# Patient Record
Sex: Male | Born: 1955 | Race: White | Hispanic: No | Marital: Married | Smoking: Never smoker
Health system: Southern US, Community
[De-identification: ages and names within clinical notes are randomized; demographics above are authoritative.]

## PROBLEM LIST (undated history)

## (undated) DIAGNOSIS — R112 Nausea with vomiting, unspecified: Secondary | ICD-10-CM

## (undated) DIAGNOSIS — K219 Gastro-esophageal reflux disease without esophagitis: Secondary | ICD-10-CM

## (undated) DIAGNOSIS — T8859XA Other complications of anesthesia, initial encounter: Secondary | ICD-10-CM

## (undated) DIAGNOSIS — B3742 Candidal balanitis: Secondary | ICD-10-CM

## (undated) DIAGNOSIS — S82892A Other fracture of left lower leg, initial encounter for closed fracture: Secondary | ICD-10-CM

## (undated) DIAGNOSIS — J329 Chronic sinusitis, unspecified: Secondary | ICD-10-CM

## (undated) DIAGNOSIS — M4306 Spondylolysis, lumbar region: Secondary | ICD-10-CM

## (undated) DIAGNOSIS — K429 Umbilical hernia without obstruction or gangrene: Secondary | ICD-10-CM

## (undated) DIAGNOSIS — Z9889 Other specified postprocedural states: Secondary | ICD-10-CM

## (undated) DIAGNOSIS — S62639B Displaced fracture of distal phalanx of unspecified finger, initial encounter for open fracture: Secondary | ICD-10-CM

## (undated) DIAGNOSIS — M4802 Spinal stenosis, cervical region: Secondary | ICD-10-CM

## (undated) DIAGNOSIS — J302 Other seasonal allergic rhinitis: Secondary | ICD-10-CM

## (undated) DIAGNOSIS — J189 Pneumonia, unspecified organism: Secondary | ICD-10-CM

## (undated) DIAGNOSIS — E785 Hyperlipidemia, unspecified: Secondary | ICD-10-CM

## (undated) DIAGNOSIS — N529 Male erectile dysfunction, unspecified: Secondary | ICD-10-CM

## (undated) DIAGNOSIS — K644 Residual hemorrhoidal skin tags: Secondary | ICD-10-CM

## (undated) DIAGNOSIS — N401 Enlarged prostate with lower urinary tract symptoms: Secondary | ICD-10-CM

## (undated) DIAGNOSIS — M5030 Other cervical disc degeneration, unspecified cervical region: Secondary | ICD-10-CM

## (undated) DIAGNOSIS — L719 Rosacea, unspecified: Secondary | ICD-10-CM

## (undated) DIAGNOSIS — M5134 Other intervertebral disc degeneration, thoracic region: Secondary | ICD-10-CM

## (undated) DIAGNOSIS — K2970 Gastritis, unspecified, without bleeding: Secondary | ICD-10-CM

## (undated) DIAGNOSIS — K317 Polyp of stomach and duodenum: Secondary | ICD-10-CM

## (undated) HISTORY — DX: Other seasonal allergic rhinitis: J30.2

## (undated) HISTORY — DX: Other cervical disc degeneration, unspecified cervical region: M50.30

## (undated) HISTORY — DX: Chronic sinusitis, unspecified: J32.9

## (undated) HISTORY — PX: HEMORRHOID SURGERY: SHX153

## (undated) HISTORY — DX: Other fracture of left lower leg, initial encounter for closed fracture: S82.892A

## (undated) HISTORY — DX: Gastro-esophageal reflux disease without esophagitis: K21.9

## (undated) HISTORY — DX: Hyperlipidemia, unspecified: E78.5

## (undated) HISTORY — DX: Spondylolysis, lumbar region: M43.06

## (undated) HISTORY — DX: Displaced fracture of distal phalanx of unspecified finger, initial encounter for open fracture: S62.639B

## (undated) HISTORY — DX: Umbilical hernia without obstruction or gangrene: K42.9

## (undated) HISTORY — DX: Residual hemorrhoidal skin tags: K64.4

## (undated) HISTORY — PX: VEIN LIGATION AND STRIPPING: SHX2653

## (undated) HISTORY — PX: INGUINAL HERNIA REPAIR: SUR1180

## (undated) HISTORY — DX: Male erectile dysfunction, unspecified: N52.9

## (undated) HISTORY — DX: Other intervertebral disc degeneration, thoracic region: M51.34

## (undated) HISTORY — DX: Gastritis, unspecified, without bleeding: K29.70

## (undated) HISTORY — PX: HERNIA REPAIR: SHX51

## (undated) HISTORY — DX: Candidal balanitis: B37.42

## (undated) HISTORY — DX: Polyp of stomach and duodenum: K31.7

## (undated) HISTORY — DX: Rosacea, unspecified: L71.9

## (undated) HISTORY — PX: ROTATOR CUFF REPAIR: SHX139

## (undated) HISTORY — PX: COLONOSCOPY: SHX174

## (undated) HISTORY — DX: Spinal stenosis, cervical region: M48.02

## (undated) HISTORY — PX: GALLBLADDER SURGERY: SHX652

## (undated) HISTORY — PX: CHOLECYSTECTOMY: SHX55

## (undated) HISTORY — DX: Benign prostatic hyperplasia with lower urinary tract symptoms: N40.1

---

## 2004-11-11 ENCOUNTER — Encounter: Admission: RE | Admit: 2004-11-11 | Discharge: 2004-11-11 | Payer: Self-pay | Admitting: Unknown Physician Specialty

## 2004-12-21 ENCOUNTER — Encounter: Admission: RE | Admit: 2004-12-21 | Discharge: 2004-12-21 | Payer: Self-pay | Admitting: Unknown Physician Specialty

## 2004-12-26 ENCOUNTER — Ambulatory Visit: Payer: Self-pay | Admitting: Internal Medicine

## 2004-12-26 ENCOUNTER — Inpatient Hospital Stay (HOSPITAL_COMMUNITY): Admission: EM | Admit: 2004-12-26 | Discharge: 2004-12-29 | Payer: Self-pay | Admitting: Emergency Medicine

## 2005-01-06 ENCOUNTER — Encounter: Admission: RE | Admit: 2005-01-06 | Discharge: 2005-01-06 | Payer: Self-pay | Admitting: Internal Medicine

## 2005-01-20 ENCOUNTER — Encounter: Admission: RE | Admit: 2005-01-20 | Discharge: 2005-01-20 | Payer: Self-pay | Admitting: Unknown Physician Specialty

## 2005-05-25 ENCOUNTER — Encounter: Admission: RE | Admit: 2005-05-25 | Discharge: 2005-05-25 | Payer: Self-pay | Admitting: Internal Medicine

## 2006-01-05 IMAGING — US IR TRANSCATH EMBOLIZATION NON-NEURO EA OP FIELD
1 series · 1 of 1 positions shown · non-contrast
Comparison: none

CLINICAL DATA: Symptomatic right leg varicose veins.

[Series 1: unknown · 0.07mm/px · 1 of 1 slices shown]
[im 1/1]
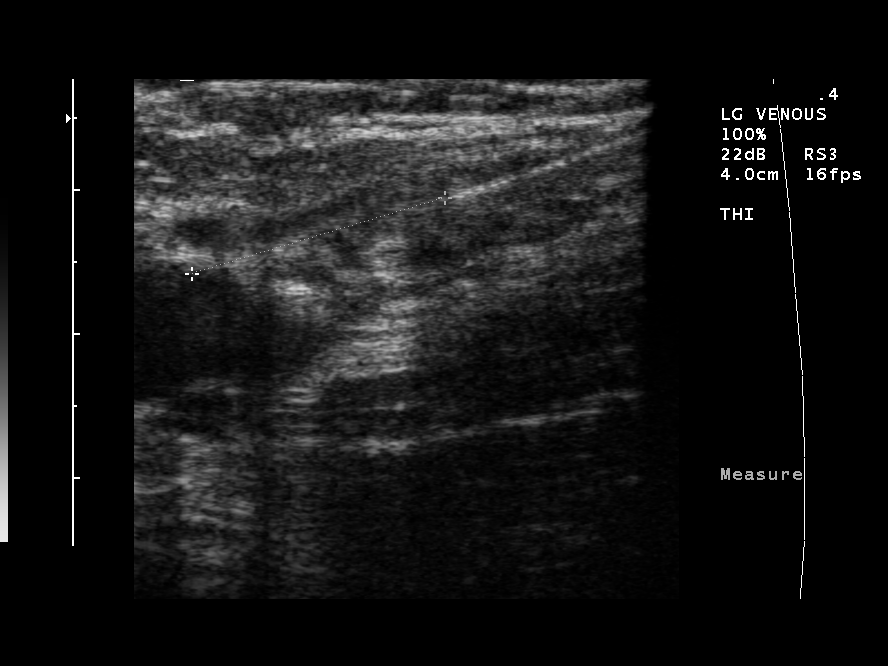

[1 of 1 positions shown; findings below may reference images not displayed]

TRANSCATHETER LASER OCCLUSION  RIGHT GREATER SAPHENOUS VEIN WITH ULTRASOUND
GUIDANCE:

Survey ultrasound of the leg was performed and the course of the greater
saphenous vein was marked on the skin. Overlying skin prepped with Betadine,
draped in usual sterile fashion. After local anesthetic using 1% lidocaine, the
greater saphenous vein was accessed just above the ankle level under ultrasound
with a 21-gauge micropuncture needle. A 018 guidewire advanced easily. This was
exchanged using a transitional dilator for a 035 J wire. This passed to the
saphenofemoral junction. Over this, the 6 French delivery sheath was advanced to
the central aspect of the right greater saphenous vein; it was approximately 7
cm distal to the saphenofemoral junction. For this reason, a second access along
the right greater saphenous vein above-the-knee was achieved using a 21-gauge
micropuncture needle as above with ultrasound guidance. The 018 guidewire
advanced easily, and a transitional dilator was placed. The laser fiber was 
positioned just cephalad to the second access site. Tumescent dilute 0.1%
lidocaine  used along the planned inferior treatment length. Ultrasound was used
to confirm appropriate tumescent anesthesia and to verify that all segments were
at least 1 cm from the skin surface. The laser fiber was activated while being
withdrawn over the treatment length, delivering 1125 joules over 183 seconds.
The catheter and sheath were removed and hemostasis easily achieved at the site.
The 035 J wire was advanced through the transitional dilator beyond the
saphenofemoral junction. Over this, the 6 French delivery sheath was advanced
beyond the saphenofemoral junction. The laser fiber was advanced and positioned
18 mm from the saphenofemoral junction. Tumescent dilute 0.1% lidocaine  used
along the planned treatment length. A total of 500 mL was used for the case.
Ultrasound was used to confirm appropriate tumescent anesthesia and to verify
that all segments were at least 1 cm from the skin surface. The laser fiber was
activated while being withdrawn over the treatment length, delivering 0090
joules over 180 seconds. The catheter and sheath were removed and hemostasis
easily achieved at the site. Patient was placed in graduated compression
stockings and ambulated for 20 minutes without difficulty. Patient tolerated the
procedure well, with no immediate complication.
IMPRESSION: 1. Technically successful transcatheter laser occlusion of right greater
saphenous vein. Patient will followup in clinic in one week. Patient knows to

## 2010-03-13 ENCOUNTER — Encounter: Payer: Self-pay | Admitting: Unknown Physician Specialty

## 2015-06-17 DIAGNOSIS — M12572 Traumatic arthropathy, left ankle and foot: Secondary | ICD-10-CM

## 2015-06-17 HISTORY — DX: Traumatic arthropathy, left ankle and foot: M12.572

## 2016-03-03 DIAGNOSIS — H25813 Combined forms of age-related cataract, bilateral: Secondary | ICD-10-CM | POA: Diagnosis not present

## 2016-04-05 DIAGNOSIS — M545 Low back pain: Secondary | ICD-10-CM | POA: Diagnosis not present

## 2016-04-05 DIAGNOSIS — M5442 Lumbago with sciatica, left side: Secondary | ICD-10-CM | POA: Diagnosis not present

## 2016-04-05 DIAGNOSIS — M5441 Lumbago with sciatica, right side: Secondary | ICD-10-CM | POA: Diagnosis not present

## 2016-04-08 DIAGNOSIS — M50323 Other cervical disc degeneration at C6-C7 level: Secondary | ICD-10-CM | POA: Diagnosis not present

## 2016-04-08 DIAGNOSIS — K219 Gastro-esophageal reflux disease without esophagitis: Secondary | ICD-10-CM | POA: Diagnosis not present

## 2016-04-08 DIAGNOSIS — M5412 Radiculopathy, cervical region: Secondary | ICD-10-CM | POA: Diagnosis not present

## 2016-04-08 DIAGNOSIS — M50322 Other cervical disc degeneration at C5-C6 level: Secondary | ICD-10-CM | POA: Diagnosis not present

## 2016-04-08 DIAGNOSIS — M5134 Other intervertebral disc degeneration, thoracic region: Secondary | ICD-10-CM | POA: Diagnosis not present

## 2016-04-08 DIAGNOSIS — R0789 Other chest pain: Secondary | ICD-10-CM | POA: Diagnosis not present

## 2016-04-08 DIAGNOSIS — M546 Pain in thoracic spine: Secondary | ICD-10-CM | POA: Diagnosis not present

## 2016-04-08 DIAGNOSIS — M50221 Other cervical disc displacement at C4-C5 level: Secondary | ICD-10-CM | POA: Diagnosis not present

## 2016-04-12 DIAGNOSIS — M5114 Intervertebral disc disorders with radiculopathy, thoracic region: Secondary | ICD-10-CM | POA: Diagnosis not present

## 2016-04-12 DIAGNOSIS — M5012 Mid-cervical disc disorder, unspecified level: Secondary | ICD-10-CM | POA: Diagnosis not present

## 2016-04-12 DIAGNOSIS — M6281 Muscle weakness (generalized): Secondary | ICD-10-CM | POA: Diagnosis not present

## 2016-04-14 DIAGNOSIS — M6281 Muscle weakness (generalized): Secondary | ICD-10-CM | POA: Diagnosis not present

## 2016-04-14 DIAGNOSIS — M5114 Intervertebral disc disorders with radiculopathy, thoracic region: Secondary | ICD-10-CM | POA: Diagnosis not present

## 2016-04-14 DIAGNOSIS — M5012 Mid-cervical disc disorder, unspecified level: Secondary | ICD-10-CM | POA: Diagnosis not present

## 2016-04-15 DIAGNOSIS — M5030 Other cervical disc degeneration, unspecified cervical region: Secondary | ICD-10-CM | POA: Diagnosis not present

## 2016-04-15 DIAGNOSIS — M4726 Other spondylosis with radiculopathy, lumbar region: Secondary | ICD-10-CM | POA: Diagnosis not present

## 2016-04-21 DIAGNOSIS — M5012 Mid-cervical disc disorder, unspecified level: Secondary | ICD-10-CM | POA: Diagnosis not present

## 2016-04-21 DIAGNOSIS — M6281 Muscle weakness (generalized): Secondary | ICD-10-CM | POA: Diagnosis not present

## 2016-04-21 DIAGNOSIS — M5114 Intervertebral disc disorders with radiculopathy, thoracic region: Secondary | ICD-10-CM | POA: Diagnosis not present

## 2016-04-22 DIAGNOSIS — M5137 Other intervertebral disc degeneration, lumbosacral region: Secondary | ICD-10-CM | POA: Diagnosis not present

## 2016-04-22 DIAGNOSIS — M542 Cervicalgia: Secondary | ICD-10-CM | POA: Diagnosis not present

## 2016-04-22 DIAGNOSIS — M545 Low back pain: Secondary | ICD-10-CM | POA: Diagnosis not present

## 2016-04-26 DIAGNOSIS — M5114 Intervertebral disc disorders with radiculopathy, thoracic region: Secondary | ICD-10-CM | POA: Diagnosis not present

## 2016-04-26 DIAGNOSIS — M6281 Muscle weakness (generalized): Secondary | ICD-10-CM | POA: Diagnosis not present

## 2016-04-26 DIAGNOSIS — M5012 Mid-cervical disc disorder, unspecified level: Secondary | ICD-10-CM | POA: Diagnosis not present

## 2016-05-18 DIAGNOSIS — M5137 Other intervertebral disc degeneration, lumbosacral region: Secondary | ICD-10-CM | POA: Diagnosis not present

## 2016-05-18 DIAGNOSIS — M542 Cervicalgia: Secondary | ICD-10-CM | POA: Diagnosis not present

## 2016-05-18 DIAGNOSIS — M50123 Cervical disc disorder at C6-C7 level with radiculopathy: Secondary | ICD-10-CM | POA: Diagnosis not present

## 2016-05-27 DIAGNOSIS — L719 Rosacea, unspecified: Secondary | ICD-10-CM | POA: Diagnosis not present

## 2016-05-27 DIAGNOSIS — Z0000 Encounter for general adult medical examination without abnormal findings: Secondary | ICD-10-CM | POA: Diagnosis not present

## 2016-05-27 DIAGNOSIS — R7301 Impaired fasting glucose: Secondary | ICD-10-CM | POA: Diagnosis not present

## 2016-05-27 DIAGNOSIS — E785 Hyperlipidemia, unspecified: Secondary | ICD-10-CM | POA: Diagnosis not present

## 2016-06-01 DIAGNOSIS — T1512XA Foreign body in conjunctival sac, left eye, initial encounter: Secondary | ICD-10-CM | POA: Diagnosis not present

## 2016-06-09 DIAGNOSIS — L03032 Cellulitis of left toe: Secondary | ICD-10-CM | POA: Diagnosis not present

## 2016-12-02 DIAGNOSIS — E785 Hyperlipidemia, unspecified: Secondary | ICD-10-CM | POA: Diagnosis not present

## 2016-12-02 DIAGNOSIS — R7301 Impaired fasting glucose: Secondary | ICD-10-CM | POA: Diagnosis not present

## 2016-12-02 DIAGNOSIS — Z0000 Encounter for general adult medical examination without abnormal findings: Secondary | ICD-10-CM | POA: Diagnosis not present

## 2016-12-15 DIAGNOSIS — M722 Plantar fascial fibromatosis: Secondary | ICD-10-CM

## 2016-12-15 DIAGNOSIS — L600 Ingrowing nail: Secondary | ICD-10-CM

## 2016-12-15 HISTORY — DX: Plantar fascial fibromatosis: M72.2

## 2016-12-15 HISTORY — DX: Ingrowing nail: L60.0

## 2016-12-29 DIAGNOSIS — M6702 Short Achilles tendon (acquired), left ankle: Secondary | ICD-10-CM

## 2016-12-29 DIAGNOSIS — M722 Plantar fascial fibromatosis: Secondary | ICD-10-CM | POA: Diagnosis not present

## 2016-12-29 DIAGNOSIS — L600 Ingrowing nail: Secondary | ICD-10-CM | POA: Diagnosis not present

## 2016-12-29 HISTORY — DX: Short Achilles tendon (acquired), left ankle: M67.02

## 2017-02-09 DIAGNOSIS — J019 Acute sinusitis, unspecified: Secondary | ICD-10-CM | POA: Diagnosis not present

## 2017-03-14 DIAGNOSIS — R0789 Other chest pain: Secondary | ICD-10-CM | POA: Diagnosis not present

## 2017-03-14 DIAGNOSIS — K219 Gastro-esophageal reflux disease without esophagitis: Secondary | ICD-10-CM | POA: Diagnosis not present

## 2017-03-27 DIAGNOSIS — K5730 Diverticulosis of large intestine without perforation or abscess without bleeding: Secondary | ICD-10-CM | POA: Diagnosis not present

## 2017-03-27 DIAGNOSIS — K219 Gastro-esophageal reflux disease without esophagitis: Secondary | ICD-10-CM | POA: Diagnosis not present

## 2017-03-31 DIAGNOSIS — K219 Gastro-esophageal reflux disease without esophagitis: Secondary | ICD-10-CM | POA: Diagnosis not present

## 2017-03-31 DIAGNOSIS — R0789 Other chest pain: Secondary | ICD-10-CM | POA: Diagnosis not present

## 2017-04-18 DIAGNOSIS — K5730 Diverticulosis of large intestine without perforation or abscess without bleeding: Secondary | ICD-10-CM | POA: Diagnosis not present

## 2017-04-18 DIAGNOSIS — D131 Benign neoplasm of stomach: Secondary | ICD-10-CM | POA: Diagnosis not present

## 2017-04-18 DIAGNOSIS — K621 Rectal polyp: Secondary | ICD-10-CM | POA: Diagnosis not present

## 2017-04-18 DIAGNOSIS — Z1211 Encounter for screening for malignant neoplasm of colon: Secondary | ICD-10-CM | POA: Diagnosis not present

## 2017-04-18 DIAGNOSIS — K317 Polyp of stomach and duodenum: Secondary | ICD-10-CM | POA: Diagnosis not present

## 2017-04-18 DIAGNOSIS — D126 Benign neoplasm of colon, unspecified: Secondary | ICD-10-CM | POA: Diagnosis not present

## 2017-05-22 DIAGNOSIS — D126 Benign neoplasm of colon, unspecified: Secondary | ICD-10-CM | POA: Diagnosis not present

## 2017-05-22 DIAGNOSIS — K5730 Diverticulosis of large intestine without perforation or abscess without bleeding: Secondary | ICD-10-CM | POA: Diagnosis not present

## 2017-05-22 DIAGNOSIS — K760 Fatty (change of) liver, not elsewhere classified: Secondary | ICD-10-CM | POA: Diagnosis not present

## 2017-06-02 DIAGNOSIS — L719 Rosacea, unspecified: Secondary | ICD-10-CM | POA: Diagnosis not present

## 2017-06-02 DIAGNOSIS — Z0000 Encounter for general adult medical examination without abnormal findings: Secondary | ICD-10-CM | POA: Diagnosis not present

## 2017-06-02 DIAGNOSIS — R7301 Impaired fasting glucose: Secondary | ICD-10-CM | POA: Diagnosis not present

## 2017-06-02 DIAGNOSIS — E785 Hyperlipidemia, unspecified: Secondary | ICD-10-CM | POA: Diagnosis not present

## 2017-07-12 DIAGNOSIS — N481 Balanitis: Secondary | ICD-10-CM | POA: Diagnosis not present

## 2017-07-12 DIAGNOSIS — M5030 Other cervical disc degeneration, unspecified cervical region: Secondary | ICD-10-CM | POA: Diagnosis not present

## 2017-10-06 DIAGNOSIS — N471 Phimosis: Secondary | ICD-10-CM | POA: Diagnosis not present

## 2017-10-06 DIAGNOSIS — N3000 Acute cystitis without hematuria: Secondary | ICD-10-CM | POA: Diagnosis not present

## 2017-10-12 DIAGNOSIS — N478 Other disorders of prepuce: Secondary | ICD-10-CM | POA: Diagnosis not present

## 2017-10-12 DIAGNOSIS — N471 Phimosis: Secondary | ICD-10-CM | POA: Diagnosis not present

## 2017-10-24 DIAGNOSIS — N3020 Other chronic cystitis without hematuria: Secondary | ICD-10-CM | POA: Diagnosis not present

## 2017-10-24 DIAGNOSIS — N471 Phimosis: Secondary | ICD-10-CM | POA: Diagnosis not present

## 2017-11-07 DIAGNOSIS — N471 Phimosis: Secondary | ICD-10-CM | POA: Diagnosis not present

## 2017-11-07 DIAGNOSIS — N3020 Other chronic cystitis without hematuria: Secondary | ICD-10-CM | POA: Diagnosis not present

## 2017-11-15 DIAGNOSIS — K580 Irritable bowel syndrome with diarrhea: Secondary | ICD-10-CM | POA: Diagnosis not present

## 2017-11-15 DIAGNOSIS — Z6833 Body mass index (BMI) 33.0-33.9, adult: Secondary | ICD-10-CM | POA: Diagnosis not present

## 2017-11-15 DIAGNOSIS — K219 Gastro-esophageal reflux disease without esophagitis: Secondary | ICD-10-CM | POA: Diagnosis not present

## 2017-12-07 DIAGNOSIS — E785 Hyperlipidemia, unspecified: Secondary | ICD-10-CM | POA: Diagnosis not present

## 2017-12-07 DIAGNOSIS — R7301 Impaired fasting glucose: Secondary | ICD-10-CM | POA: Diagnosis not present

## 2017-12-07 DIAGNOSIS — L719 Rosacea, unspecified: Secondary | ICD-10-CM | POA: Diagnosis not present

## 2018-02-23 DIAGNOSIS — K589 Irritable bowel syndrome without diarrhea: Secondary | ICD-10-CM | POA: Diagnosis not present

## 2018-02-23 DIAGNOSIS — J019 Acute sinusitis, unspecified: Secondary | ICD-10-CM | POA: Diagnosis not present

## 2018-03-19 DIAGNOSIS — J019 Acute sinusitis, unspecified: Secondary | ICD-10-CM | POA: Diagnosis not present

## 2018-03-28 DIAGNOSIS — Z6833 Body mass index (BMI) 33.0-33.9, adult: Secondary | ICD-10-CM | POA: Diagnosis not present

## 2018-03-28 DIAGNOSIS — R1032 Left lower quadrant pain: Secondary | ICD-10-CM | POA: Diagnosis not present

## 2018-04-02 DIAGNOSIS — N400 Enlarged prostate without lower urinary tract symptoms: Secondary | ICD-10-CM | POA: Diagnosis not present

## 2018-04-02 DIAGNOSIS — R1032 Left lower quadrant pain: Secondary | ICD-10-CM | POA: Diagnosis not present

## 2018-04-02 DIAGNOSIS — K5730 Diverticulosis of large intestine without perforation or abscess without bleeding: Secondary | ICD-10-CM | POA: Diagnosis not present

## 2018-04-27 DIAGNOSIS — R1032 Left lower quadrant pain: Secondary | ICD-10-CM | POA: Diagnosis not present

## 2018-04-27 DIAGNOSIS — M47817 Spondylosis without myelopathy or radiculopathy, lumbosacral region: Secondary | ICD-10-CM | POA: Diagnosis not present

## 2018-06-12 DIAGNOSIS — R7301 Impaired fasting glucose: Secondary | ICD-10-CM | POA: Diagnosis not present

## 2018-06-12 DIAGNOSIS — L719 Rosacea, unspecified: Secondary | ICD-10-CM | POA: Diagnosis not present

## 2018-06-12 DIAGNOSIS — Z0000 Encounter for general adult medical examination without abnormal findings: Secondary | ICD-10-CM | POA: Diagnosis not present

## 2018-06-12 DIAGNOSIS — E785 Hyperlipidemia, unspecified: Secondary | ICD-10-CM | POA: Diagnosis not present

## 2019-03-20 ENCOUNTER — Other Ambulatory Visit: Payer: Self-pay

## 2019-03-20 ENCOUNTER — Encounter: Payer: Self-pay | Admitting: Pulmonary Disease

## 2019-03-20 ENCOUNTER — Ambulatory Visit (INDEPENDENT_AMBULATORY_CARE_PROVIDER_SITE_OTHER): Payer: Commercial Managed Care - PPO

## 2019-03-20 ENCOUNTER — Ambulatory Visit: Payer: Commercial Managed Care - PPO | Admitting: Pulmonary Disease

## 2019-03-20 ENCOUNTER — Telehealth: Payer: Self-pay | Admitting: Pulmonary Disease

## 2019-03-20 VITALS — BP 118/70 | HR 70 | Temp 97.5°F | Ht 69.0 in | Wt 220.8 lb

## 2019-03-20 DIAGNOSIS — U071 COVID-19: Secondary | ICD-10-CM

## 2019-03-20 DIAGNOSIS — J1282 Pneumonia due to coronavirus disease 2019: Secondary | ICD-10-CM

## 2019-03-20 NOTE — Telephone Encounter (Signed)
I called and spoke with patient and made him aware of his chest x-ray results. He verbalized understanding.

## 2019-03-20 NOTE — Progress Notes (Signed)
Marc Faulkner    ZN:[id number]    [birth year]  Primary Care Physician:[facility] [place]  Referring Physician: Nicoletta Dress, MD [address] [place],  [place] [address]  Chief complaint:  Consult for Post COVID 36  HPI: 64 year old with history of allergies, GERD, hyperlipidemia. Diagnosed with COVID-19 in early December.  Evaluated at [facility] and sent home with Decadron.  He is also received multiple courses of antibiotics including Bactrim, Z-Pak and Augmentin as an outpatient.  Initially on supplemental oxygen but has been himself off  Chest x-ray primary care in mid January apparently showed persistent opacities and he has been referred here for further evaluation States that his breathing is doing well with no complaints.  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  No mold, hot tub, Jacuzzi.  No down pillows or comforters Smoking history: No smoke Travel history: Significant travel  Relevant family history: No significant family history of lung disease   No outpatient encounter medications on file as of 03/20/2019.   No facility-administered encounter medications on file as of 03/20/2019.    Allergies as of 03/20/2019  . (Not on File)    No past medical history on file.  Past Surgical History:  Procedure Laterality Date  . GALLBLADDER SURGERY    . HERNIA REPAIR     umbilical     No family history on file.  Social History   Socioeconomic History  . Marital status: Married    Spouse name: Not on file  . Number of children: Not on file  . Years of education: Not on file  . Highest education level: Not on file  Occupational History  . Not on file  Tobacco Use  . Smoking status: Not on file  Substance and Sexual Activity  . Alcohol use: Not on file  . Drug use: Not  on file  . Sexual activity: Not on file  Other Topics Concern  . Not on file  Social History Narrative  . Not on file   Social Determinants of Health   Financial Resource Strain:   . Difficulty of Paying Living Expenses: Not on file  Food Insecurity:   . Worried About Charity fundraiser in the Last Year: Not on file  . Ran Out of Food in the Last Year: Not on file  Transportation Needs:   . Lack of Transportation (Medical): Not on file  . Lack of Transportation (Non-Medical): Not on file  Physical Activity:   . Days of Exercise per Week: Not on file  . Minutes of Exercise per Session: Not on file  Stress:   . Feeling of Stress : Not on file  Social Connections:   . Frequency of Communication with Friends and Family: Not on file  . Frequency of Social Gatherings with Friends and Family: Not on file  . Attends Religious Services: Not on file  . Active Member of Clubs or Organizations: Not on file  . Attends Archivist Meetings: Not on file  . Marital Status: Not on file  Intimate Partner Violence:   . Fear of Current or Ex-Partner: Not on file  . Emotionally Abused: Not on file  . Physically Abused: Not on file  . Sexually Abused: Not on file    Review of systems: Review  of Systems  Constitutional: Negative for fever and chills.  HENT: Negative.   Eyes: Negative for blurred vision.  Respiratory: as per HPI  Cardiovascular: Negative for chest pain and palpitations.  Gastrointestinal: Negative for vomiting, diarrhea, blood per rectum. Genitourinary: Negative for dysuria, urgency, frequency and hematuria.  Musculoskeletal: Negative for myalgias, back pain and joint pain.  Skin: Negative for itching and rash.  Neurological: Negative for dizziness, tremors, focal weakness, seizures and loss of consciousness.  Endo/Heme/Allergies: Negative for environmental allergies.  Psychiatric/Behavioral: Negative for depression, suicidal ideas and hallucinations.  All other  systems reviewed and are negative.  Physical Exam: There were no vitals taken for this visit. Gen:      No acute distress HEENT:  EOMI, sclera anicteric Neck:     No masses; no thyromegaly Lungs:    Clear to auscultation bilaterally; normal respiratory effort CV:         Regular rate and rhythm; no murmurs Abd:      + bowel sounds; soft, non-tender; no palpable masses, no distension Ext:    No edema; adequate peripheral perfusion Skin:      Warm and dry; no rash Neuro: alert and oriented x 3 Psych: normal mood and affect  Data Reviewed: Imaging: CT abdomen pelvis 04/02/2018-visualized lung bases are normal Chest x-ray 01/27/2019 no acute cardiopulmonary abnormality Chest x-ray 01/31/2019 hazy bilateral airspace opacities Chest x-ray 02/18/2019 mild worsening of peripheral bilateral lower lobe airspace disease Chest x-ray persistent bilateral airspace opacity with mild improvement compared to prior. 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  If still abnormal then schedule high-res CT, PFTs  Plan/Recommendations: Chest x-ray  This appointment required 45 minutes of patient care (this includes precharting, chart review, review of results, face-to-face care, etc.).  Marshell Garfinkel MD [facility] 03/20/2019, 8:45 AM  CC: Nicoletta Dress, MD

## 2019-03-20 NOTE — Patient Instructions (Addendum)
I am glad you are starting to feel better We will get a chest x-ray today for monitoring Follow-up in 3 months

## 2020-07-16 ENCOUNTER — Other Ambulatory Visit: Payer: Self-pay | Admitting: *Deleted

## 2020-07-16 DIAGNOSIS — I83893 Varicose veins of bilateral lower extremities with other complications: Secondary | ICD-10-CM

## 2020-07-23 ENCOUNTER — Encounter: Payer: Self-pay | Admitting: Cardiology

## 2020-07-23 ENCOUNTER — Encounter: Payer: Self-pay | Admitting: *Deleted

## 2020-07-23 DIAGNOSIS — E7800 Pure hypercholesterolemia, unspecified: Secondary | ICD-10-CM

## 2020-07-23 DIAGNOSIS — M1990 Unspecified osteoarthritis, unspecified site: Secondary | ICD-10-CM

## 2020-07-23 DIAGNOSIS — M179 Osteoarthritis of knee, unspecified: Secondary | ICD-10-CM | POA: Insufficient documentation

## 2020-07-23 DIAGNOSIS — K219 Gastro-esophageal reflux disease without esophagitis: Secondary | ICD-10-CM

## 2020-07-23 DIAGNOSIS — H9190 Unspecified hearing loss, unspecified ear: Secondary | ICD-10-CM | POA: Insufficient documentation

## 2020-07-23 HISTORY — DX: Pure hypercholesterolemia, unspecified: E78.00

## 2020-07-23 HISTORY — DX: Unspecified hearing loss, unspecified ear: H91.90

## 2020-07-23 HISTORY — DX: Unspecified osteoarthritis, unspecified site: M19.90

## 2020-07-23 HISTORY — DX: Gastro-esophageal reflux disease without esophagitis: K21.9

## 2020-08-06 NOTE — Progress Notes (Addendum)
[facility]           [facility] [place]  History and Physical   Marc Faulkner is a 65 y.o. male who presents with varicose veins.  He states that he had seen his PCP Dr. Delena Bali and he wanted him evaluated.  Pt has hx of varicose veins and has hx of right leg laser ablation with [facility] around 15 or so years ago.  Pt states about 3 days later, he had redness in his leg and went to [facility] and was found to have infection and was admitted for 5 days afterward getting abx.  He states he gets some swelling in his legs more so in the right leg.  He states that it is better in the mornings after he has been in bed all night.  He really doesn't have any achiness or tiredness in his legs.  He has a place on the medial aspect of the right knee at a varicosity that has been painful in the past, but he has not had any trouble with this lately.  He has never had a DVT.  He does have family hx as his mother, grandmother and grandfather all had varicose veins.  He does have some small veins around his lower legs.  He has had two episodes of bleeding from these on the right lower leg.  He held pressure and used ice and they stopped bleeding.  He does wear 69mmHg knee high compression every day.  He gets 10k - 20k steps per day.  He does not do a lot of sitting.  He has lost close to 9lbs by changing from sweet tea to unsweetened tea and modifying his diet.   He has right knee issues, hx of left ankle fx.  He has had cholecystectomy and hx of inguinal hernia repair on the right as well as umbilical hernia repair.   He is here with his wife of 104 years.  He continues to work at [employer] where he has worked for close to 47 years.    Pt does not have family hx of AAA.  The pt is not on a statin for cholesterol management.  The pt is not on a daily aspirin.   Other AC:  none The pt is not on medication for hypertension.   The pt is not diabetic.   Tobacco hx:   never   Past Medical History:  Diagnosis Date   Allergic rhinitis, seasonal    Benign prostatic hyperplasia with lower urinary tract symptoms    Bleeding external hemorrhoids    Candidal balanitis    Chronic sinusitis    DDD (degenerative disc disease), cervical    DDD (degenerative disc disease), thoracic    Esophageal reflux 07/23/2020   Fracture of finger, distal phalanx, right, open    Fracture of left ankle    GERD (gastroesophageal reflux disease)    Hearing loss 02/27/107   Hernia, umbilical    Hypercholesteremia 07/23/2020   Hyperlipidemia    Impotence of organic origin    Ingrown nail 12/15/2016   Lumbar spondylolysis    Neural foraminal stenosis of cervical spine    Osteoarthritis 07/23/2020   Plantar fasciitis 12/15/2016   Rosacea    Tightness of heel cord, left 12/29/2016   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    Past Surgical History:  Procedure Laterality Date   CHOLECYSTECTOMY     GALLBLADDER SURGERY  HEMORRHOID SURGERY     HERNIA REPAIR     umbilical    INGUINAL HERNIA REPAIR Left    ROTATOR CUFF REPAIR Right    VEIN LIGATION AND STRIPPING      Social History   Socioeconomic History   Marital status: Married    Spouse name: Not on file   Number of children: Not on file   Years of education: Not on file   Highest education level: Not on file  Occupational History   Not on file  Tobacco Use   Smoking status: Never   Smokeless tobacco: Never  Substance and Sexual Activity   Alcohol use: Not Currently   Drug use: Never   Sexual activity: Not on file  Other Topics Concern   Not on file  Social History Narrative   Not on file   Social Determinants of Health   Financial Resource Strain: Not on file  Food Insecurity: Not on file  Transportation Needs: Not on file  Physical Activity: Not on file  Stress: Not on file  Social Connections: Not on file  Intimate Partner Violence: Not on file    Family History  Problem Relation Age of  Onset   Ovarian cancer Mother    Diabetes Mother    Rheumatologic disease Mother    Leukemia Mother    Heart attack Mother    Bladder Cancer Mother    Heart disease Maternal Grandmother     Current Outpatient Medications  Medication Sig Dispense Refill   celecoxib (CELEBREX) 200 MG capsule Take 200 mg by mouth 2 (two) times daily.      cetirizine (ZYRTEC) 10 MG tablet Take 10 mg by mouth daily.      cyclobenzaprine (FLEXERIL) 10 MG tablet Take 1 tablet by mouth 2 (two) times daily as needed for muscle spasms.     doxycycline (VIBRA-TABS) 100 MG tablet Take 100 mg by mouth daily.      fenofibrate 160 MG tablet Take 160 mg by mouth daily.      gabapentin (NEURONTIN) 300 MG capsule Take 1 capsule by mouth at bedtime.     metroNIDAZOLE (METROGEL) 1 % gel Apply 1 application topically 2 (two) times daily as needed (rosacea).     omeprazole (PRILOSEC) 20 MG capsule Take 20 mg by mouth 2 (two) times daily before a meal.     tamsulosin (FLOMAX) 0.4 MG CAPS capsule Take 0.4 mg by mouth daily.     No current facility-administered medications for this visit.    Allergies  Allergen Reactions   Sulfa Antibiotics Nausea And Vomiting    Fever    REVIEW OF SYSTEMS:   [X]  denotes positive finding, [ ]  denotes negative finding Cardiac  Comments:  Chest pain or chest pressure:    Shortness of breath upon exertion:    Short of breath when lying flat:    Irregular heart rhythm:        Vascular    Pain in calf, thigh, or hip brought on by ambulation:    Pain in feet at night that wakes you up from your sleep:     Blood clot in your veins:    Leg swelling:  x       Pulmonary    Oxygen at home:    Productive cough:     Wheezing:         Neurologic    Sudden weakness in arms or legs:     Sudden numbness in arms or legs:  Sudden onset of difficulty speaking or slurred speech:    Temporary loss of vision in one eye:     Problems with dizziness:         Gastrointestinal    Blood in  stool:     Vomited blood:         Genitourinary    Burning when urinating:     Blood in urine:        Psychiatric    Major depression:         Hematologic    Bleeding problems:    Problems with blood clotting too easily:        Skin    Rashes or ulcers:        Constitutional    Fever or chills:      PHYSICAL EXAMINATION:  Today's Vitals   08/07/20 1320  BP: 139/84  Pulse: 79  Resp: 14  Temp: 98 F (36.7 C)  TempSrc: Temporal  SpO2: 97%  Weight: 228 lb 4.8 oz (103.6 kg)  Height: 5\' 11"  (1.803 m)  PainSc: 0-No pain   Body mass index is 31.84 kg/m.   General:  WDWN in NAD; vital signs documented above Gait: Not observed HENT: WNL, normocephalic Pulmonary: normal non-labored breathing without wheezing Cardiac: regular HR; without carotid bruits Abdomen: soft, NT, no masses; aortic pulse is not palpable Skin: without rashes Vascular Exam/Pulses:  Right Left  Radial 2+ (normal) 2+ (normal)  DP 2+ (normal) 2+ (normal)  PT 2+ (normal) 2+ (normal)   Extremities: large varicosities bilateral legs with right worse than left.  He does have swelling in the right leg > left left.   Neurologic: A&O X 3;  moving all extremities equally Psychiatric:  The pt has normal affect.   Non-Invasive Vascular Imaging:   Venous duplex on 08/07/2020: Venous Reflux Times  +--------------+---------+------+-----------+------------+--------+  RIGHT         Reflux NoRefluxReflux TimeDiameter cmsComments                          Yes                                   +--------------+---------+------+-----------+------------+--------+  CFV                     yes   >1 second                       +--------------+---------+------+-----------+------------+--------+  FV mid                  yes   >1 second                       +--------------+---------+------+-----------+------------+--------+  Popliteal               yes   >1 second                        +--------------+---------+------+-----------+------------+--------+  GSV at SFJ              yes    >500 ms      0.53              +--------------+---------+------+-----------+------------+--------+  GSV prox thigh          yes    >500 ms  0.71              +--------------+---------+------+-----------+------------+--------+  GSV mid thigh           yes    >500 ms      0.54              +--------------+---------+------+-----------+------------+--------+  GSV dist thigh          yes    >500 ms      0.32              +--------------+---------+------+-----------+------------+--------+  GSV at knee             yes    >500 ms      0.94              +--------------+---------+------+-----------+------------+--------+  GSV prox calf           yes    >500 ms      0.69              +--------------+---------+------+-----------+------------+--------+  SSV Pop Fossa no                            0.19              +--------------+---------+------+-----------+------------+--------+  SSV prox calf no                            0.34              +--------------+---------+------+-----------+------------+--------+  SSV mid calf  no                            0.3               +--------------+---------+------+-----------+------------+--------+      +--------------+---------+------+-----------+------------+--------+  LEFT          Reflux NoRefluxReflux TimeDiameter cmsComments                          Yes                                   +--------------+---------+------+-----------+------------+--------+  CFV           no                                              +--------------+---------+------+-----------+------------+--------+  FV mid        no                                              +--------------+---------+------+-----------+------------+--------+  Popliteal               yes   >1 second                        +--------------+---------+------+-----------+------------+--------+  GSV at SFJ              yes    >500 ms      0.75              +--------------+---------+------+-----------+------------+--------+  GSV prox thigh          yes    >500 ms      0.9               +--------------+---------+------+-----------+------------+--------+  GSV mid thigh           yes    >500 ms      0.38              +--------------+---------+------+-----------+------------+--------+  GSV dist thigh          yes    >500 ms      0.32              +--------------+---------+------+-----------+------------+--------+  GSV at knee             yes    >500 ms      0.39              +--------------+---------+------+-----------+------------+--------+  GSV prox calf           yes    >500 ms      0.26              +--------------+---------+------+-----------+------------+--------+  SSV Pop Fossa no                            0.34              +--------------+---------+------+-----------+------------+--------+  SSV prox calf no                            0.35              +--------------+---------+------+-----------+------------+--------+  SSV mid calf  no                            0.28              +--------------+---------+------+-----------+------------+--------+   Summary:  Right:  - No evidence of deep vein thrombosis seen in the right lower extremity, from the common femoral through the popliteal veins.  - No evidence of superficial venous thrombosis in the right lower  extremity.  - Venous reflux is noted in the right common femoral vein.  - Venous reflux is noted in the right sapheno-femoral junction.  - Venous reflux is noted in the right greater saphenous vein in the thigh.  - Venous reflux is noted in the right greater saphenous vein in the calf.  - Venous reflux is noted in the right femoral vein.  - Venous reflux is noted in the  right popliteal vein.     Left:  - No evidence of deep vein thrombosis seen in the left lower extremity, from the common femoral through the popliteal veins.  - No evidence of superficial venous thrombosis in the left lower  extremity.  - Venous reflux is noted in the left sapheno-femoral junction.  - Venous reflux is noted in the left greater saphenous vein in the thigh.  - Venous reflux is noted in the left greater saphenous vein in the calf.  - Venous reflux is noted in the left popliteal vein.    Sherley Leser is a 65 y.o. male who presents with: varicosities and leg swelling.  -pt has easily palpable pedal pulses -pt does not have evidence of DVT.  Pt does have venous reflux bilateral and deep and GSV bilaterally.  He really is not  bothered by his varicosities at this time.  He has had some pain over the varicosity on the right side medial to the knee but this has not bothered him in quite some time.  I discussed with him that he is potentially a candidate for laser ablation, but given his lack of sx at this time, he does not want to proceed.  He does have hx of right GSV laser ablation in the past with post infection requiring hospitalization.   -he has had a couple of episodes from bleeding from superficial veins on the right leg-discussed how to hold pressure to stop bleeding if this ever occurs again in the future.  -discussed with pt about wearing knee high 15-20 mmHg compression stockings.  He knows that if he becomes more symptomatic and wants to proceed with laser ablation, he will call to let us know and will start wearing thigh high compression at that time.  He was measured today and given to him.   -discussed the importance of leg elevation and how to elevate properly - pt is advised to elevate their legs and a diagram is given to them to demonstrate to lay flat on their back with knees elevated and slightly bent with their feet higher than her knees, which puts their feet higher  than their heart for 15 minutes per day.  If they cannot lay flat, advised to lay as flat as possible.  -pt is advised to continue as much walking as possible and avoid sitting or standing for long periods of time.  -discussed importance of weight loss and exercise and that water aerobics would also be beneficial.  He has lost close to 10lbs by diet modification.  -handout with recommendations given -pt will f/u as needed.    Leontine Locket, [facility] 08/06/2020 1:07 PM  Clinic MD:  Donzetta Matters

## 2020-08-07 ENCOUNTER — Ambulatory Visit: Payer: Managed Care, Other (non HMO) | Admitting: Physician Assistant

## 2020-08-07 ENCOUNTER — Other Ambulatory Visit: Payer: Self-pay

## 2020-08-07 ENCOUNTER — Ambulatory Visit (HOSPITAL_COMMUNITY)
Admission: RE | Admit: 2020-08-07 | Discharge: 2020-08-07 | Disposition: A | Discharge status: Home / Self Care | Payer: Managed Care, Other (non HMO) | Source: Ambulatory Visit | Attending: Vascular Surgery | Admitting: Vascular Surgery

## 2020-08-07 VITALS — BP 139/84 | HR 79 | Temp 98.0°F | Resp 14 | Ht 71.0 in | Wt 228.3 lb

## 2020-08-07 DIAGNOSIS — I83893 Varicose veins of bilateral lower extremities with other complications: Secondary | ICD-10-CM

## 2020-08-07 DIAGNOSIS — M7989 Other specified soft tissue disorders: Secondary | ICD-10-CM

## 2020-08-07 DIAGNOSIS — I8393 Asymptomatic varicose veins of bilateral lower extremities: Secondary | ICD-10-CM

## 2020-08-25 NOTE — Progress Notes (Signed)
Cardiology Office Note:    Date:  08/26/2020   ID:  Marc Faulkner, DOB 01-16-1956, MRN [id number]  PCP:  Nicoletta Dress, MD  Cardiologist:  Shirlee More, MD   Referring MD: Nicoletta Dress, MD  ASSESSMENT:    1. Bilateral shoulder pain, unspecified chronicity   2. Hypercholesteremia   3. Carotid artery calcification, bilateral    PLAN:    In order of problems listed above:  His symptoms in retrospect were noncardiac he has had further evaluation confirming cervical spine disc disease and radiculopathy and is improved with appropriate treatment.  At this time I do not think he requires an ischemia evaluation. Continue current treatment, if he has occlusive plaque in the carotid would benefit from a statin Carotid duplex ordered regarding stenosis  Next appointment as needed depending on the results of his carotid duplex   Medication Adjustments/Labs and Tests Ordered: Current medicines are reviewed at length with the patient today.  Concerns regarding medicines are outlined above.  No orders of the defined types were placed in this encounter.  No orders of the defined types were placed in this encounter.    Chief Complaint  Patient presents with   Chest Pain  ED visit with complaints of several days of neck left shoulder and left arm radicular pain.  History of Present Illness:    Jahki Witham is a 65 y.o. male who is being seen today for the evaluation of chest pain at the request of Nicoletta Dress, MD.  He was seen at [facility] ED 01/31/2019 with shortness of breath and a diagnosis of COVID-19 infection.  He was tachycardic heart rate of 117 beats per minutes right tachypneic respiratory rate of 22 oxygen saturation 95%.  CBC was normal hemoglobin 15.9 sodium 137 potassium 4.1 creatinine 0.8.  His EKG was described as sinus rhythm normal chest x-ray showed hazy bilateral airspace disease consistent with pneumonia.  He was discharged on home oxygen for  outpatient treatment.  He was seen by his primary care physician 07/22/2020 and follow-up to ED visit with shoulder pain [facility].  He had a CTA chest abdomen pelvis performed which showed no cardiovascular abnormality in the chest no findings of pulmonary embolism and no evidence of acute aortic injury aneurysm or dissection.  There is notation had an EKG performed showing sinus bradycardia 56 bpm and no ischemic changes I cannot independently review it.  His high-sensitivity troponin was normal with a notation he had a second performed without a significant delta.  He is found to have acute on chronic cervical radicular pain and was discharged with referrals to spine specialist and cardiology.  Component Value Ref Range Test Method Analysis Time Performed At Pathologist Signature  Troponin I 7 <18 pg/mL   07/20/2020 10:26 AM EDT [facility]     IMPRESSION: CTA FINDINGS  1. No aortic aneurysm or dissection. 2. Mild atherosclerotic disease of the infrarenal abdominal aorta. 3. Aortic branch vessels widely patent.  NON CTA FINDINGS  1. No acute findings in the chest. 2. Liver morphology suspicious for cirrhosis. Mild splenomegaly. These findings are stable from the prior CT. 3. No acute findings in the abdomen or pelvis.  He had a lower extremity venous duplex performed 08/08/2018 showing no findings of deep or superficial thrombophlebitis but he had venous reflux noted bilaterally.  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  X-ray of the cervical spine was quite abnormal: CONCLUSION:  1. No acute fracture. 2. Approximately 3 mm anterolisthesis of C2 on C3, similar in flexion, and reduces completely in extension. 3. Approximately 2 mm retrolisthesis of C3 on C4, which reduces completely in flexion and increases to 3 mm in extension. 4. Mild  levocurvature with apex at C4-C5. 5. Moderate disc degenerative changes at C6-C7, mild at the other cervical spine levels. 6. Multilevel facet osteoarthritis, moderate at C3-C4, mild at the other levels. 7. Mild multilevel uncovertebral degenerative changes, organ 8. Moderate degenerative changes of the atlantodental joint. 9. Bilateral carotid artery calcifications.  Prior to the ED he was having severe pain over his left shoulder down his left arm proximally and his neck bilaterally.  It lasted for days it was nonexertional unrelieved with rest and after the ED visit it gradually resolved over period of 2 to 3 days.  He has no known history of heart disease congenital rheumatic or atrial fibrillation. He has a history of lipid disorder with elevated triglycerides and has been on fenofibrate for decades. Most recent lipid profile 06/18/2020 shows a total cholesterol 156 LDL 1 1 triglycerides 62 HDL 43 A1c 6.0%.  He was unaware he had vascular calcification involving his carotid arteries he has had no symptoms of visual loss or localizing weakness or dysesthesia to suggest stroke or TIA. Past Medical History:  Diagnosis Date   Allergic rhinitis, seasonal    Benign prostatic hyperplasia with lower urinary tract symptoms    Bleeding external hemorrhoids    Candidal balanitis    Chronic sinusitis    DDD (degenerative disc disease), cervical    DDD (degenerative disc disease), thoracic    Esophageal reflux 07/23/2020   Fracture of finger, distal phalanx, right, open    Fracture of left ankle    Gastric polyp    Gastritis    GERD (gastroesophageal reflux disease)    Hearing loss 72/62/0355   Hernia, umbilical    Hypercholesteremia 07/23/2020   Hyperlipidemia    Impotence of organic origin    Ingrown nail 12/15/2016   Lumbar spondylolysis    Neural foraminal stenosis of cervical spine    Osteoarthritis 07/23/2020   Plantar fasciitis 12/15/2016   Rosacea    Tightness of heel cord,  left 12/29/2016   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    cetirizine (ZYRTEC) 10 MG tablet Take 10 mg by mouth daily.    cyclobenzaprine (FLEXERIL) 10 MG tablet Take 1 tablet by mouth 2 (two) times daily as needed for muscle spasms.   doxycycline (VIBRA-TABS) 100 MG tablet Take 100 mg by mouth daily.    fenofibrate 160 MG tablet Take 160 mg by mouth daily.    metroNIDAZOLE (METROGEL) 1 % gel Apply 1 application topically 2 (two) times daily as needed (rosacea).   omeprazole (PRILOSEC) 20 MG capsule Take 20 mg by mouth 2 (two) times daily before a meal.   tamsulosin (FLOMAX) 0.4 MG CAPS capsule Take 0.4 mg by mouth  daily.     Allergies:   Sulfa antibiotics   Social History   Socioeconomic History   Marital status: Married    Spouse name: Not on file   Number of children: Not on file   Years of education: Not on file   Highest education level: Not on file  Occupational History   Not on file  Tobacco Use   Smoking status: Never   Smokeless tobacco: Never  Substance and Sexual Activity   Alcohol use: Not Currently   Drug use: Never   Sexual activity: Not on file  Other Topics Concern   Not on file  Social History Narrative   Not on file   Social Determinants of Health   Financial Resource Strain: Not on file  Food Insecurity: Not on file  Transportation Needs: Not on file  Physical Activity: Not on file  Stress: Not on file  Social Connections: Not on file     Family History: The patient's family history includes Bladder Cancer in his mother; Diabetes in his mother; Heart attack  in his mother; Heart disease in his maternal grandmother; Leukemia in his mother; Ovarian cancer in his mother; Rheumatologic disease in his mother.  ROS:   ROS Please see the history of present illness.     All other systems reviewed and are negative.  EKGs/Labs/Other Studies Reviewed:    The following studies were reviewed today:    Physical Exam:    VS:  BP 124/82 (BP Location: Right Arm, Patient Position: Sitting, Cuff Size: Normal)   Pulse 65   Ht 5\' 11"  (1.803 m)   Wt 228 lb (103.4 kg)   SpO2 95%   BMI 31.80 kg/m     Wt Readings from Last 3 Encounters:  08/26/20 228 lb (103.4 kg)  08/07/20 228 lb 4.8 oz (103.6 kg)  07/22/20 227 lb (103 kg)     GEN:  Well nourished, well developed in no acute distress HEENT: Normal NECK: No JVD; No carotid bruits LYMPHATICS: No lymphadenopathy CARDIAC: RRR, no murmurs, rubs, gallops RESPIRATORY:  Clear to auscultation without rales, wheezing or rhonchi  ABDOMEN: Soft, non-tender, non-distended MUSCULOSKELETAL:  No edema; No deformity  SKIN: Warm and dry NEUROLOGIC:  Alert and oriented x 3 PSYCHIATRIC:  Normal affect     Signed, Shirlee More, MD  08/26/2020 10:49 AM    [place]

## 2020-08-26 ENCOUNTER — Ambulatory Visit: Payer: Managed Care, Other (non HMO) | Admitting: Cardiology

## 2020-08-26 ENCOUNTER — Other Ambulatory Visit: Payer: Self-pay

## 2020-08-26 ENCOUNTER — Encounter: Payer: Self-pay | Admitting: Cardiology

## 2020-08-26 VITALS — BP 124/82 | HR 65 | Ht 71.0 in | Wt 228.0 lb

## 2020-08-26 DIAGNOSIS — M25511 Pain in right shoulder: Secondary | ICD-10-CM

## 2020-08-26 DIAGNOSIS — I6523 Occlusion and stenosis of bilateral carotid arteries: Secondary | ICD-10-CM | POA: Diagnosis not present

## 2020-08-26 DIAGNOSIS — E7800 Pure hypercholesterolemia, unspecified: Secondary | ICD-10-CM

## 2020-08-26 DIAGNOSIS — M25512 Pain in left shoulder: Secondary | ICD-10-CM

## 2020-08-26 NOTE — Patient Instructions (Signed)
Medication Instructions:  Your physician recommends that you continue on your current medications as directed. Please refer to the Current Medication list given to you today.  *If you need a refill on your cardiac medications before your next appointment, please call your pharmacy*   Lab Work: None If you have labs (blood work) drawn today and your tests are completely normal, you will receive your results only by: [place] (if you have MyChart) OR A paper copy in the mail If you have any lab test that is abnormal or we need to change your treatment, we will call you to review the results.   Testing/Procedures: Your physician has requested that you have a carotid duplex. This test is an ultrasound of the carotid arteries in your neck. It looks at blood flow through these arteries that supply the brain with blood. Allow one hour for this exam. There are no restrictions or special instructions.    Follow-Up: At [facility], you and your health needs are our priority.  As part of our continuing mission to provide you with exceptional heart care, we have created designated Provider Care Teams.  These Care Teams include your primary Cardiologist (physician) and Advanced Practice Providers (APPs -  Physician Assistants and Nurse Practitioners) who all work together to provide you with the care you need, when you need it.  We recommend signing up for the patient portal called "MyChart".  Sign up information is provided on this After Visit Summary.  MyChart is used to connect with patients for Virtual Visits (Telemedicine).  Patients are able to view lab/test results, encounter notes, upcoming appointments, etc.  Non-urgent messages can be sent to your provider as well.   To learn more about what you can do with MyChart, go to [email or web address].    Your next appointment:   As needed  The format for your next appointment:   In Person  Provider:   Shirlee More, MD   Other  Instructions

## 2020-09-11 ENCOUNTER — Ambulatory Visit (INDEPENDENT_AMBULATORY_CARE_PROVIDER_SITE_OTHER): Payer: Managed Care, Other (non HMO)

## 2020-09-11 ENCOUNTER — Other Ambulatory Visit: Payer: Self-pay

## 2020-09-11 DIAGNOSIS — I6523 Occlusion and stenosis of bilateral carotid arteries: Secondary | ICD-10-CM

## 2020-09-11 NOTE — Progress Notes (Signed)
Carotid duplex exam has been performed.  Jimmy Isack Lavalley RDCS, RVT 

## 2020-09-21 ENCOUNTER — Telehealth: Payer: Self-pay

## 2020-09-21 NOTE — Telephone Encounter (Signed)
Spoke with patient regarding results and recommendation.  Patient verbalizes understanding and is agreeable to plan of care. Advised patient to call back with any issues or concerns.  

## 2020-09-21 NOTE — Telephone Encounter (Signed)
-----   Message from Richardo Priest, MD sent at 09/20/2020 12:07 PM EDT ----- Normal or stable result  Good result the carotid arteries are normal no obstruction.

## 2021-04-27 ENCOUNTER — Other Ambulatory Visit: Payer: Self-pay

## 2021-04-27 DIAGNOSIS — I8393 Asymptomatic varicose veins of bilateral lower extremities: Secondary | ICD-10-CM

## 2021-05-12 ENCOUNTER — Encounter (HOSPITAL_COMMUNITY): Payer: Managed Care, Other (non HMO)

## 2021-05-12 ENCOUNTER — Ambulatory Visit: Payer: Managed Care, Other (non HMO) | Admitting: Vascular Surgery

## 2021-06-09 ENCOUNTER — Ambulatory Visit: Payer: Managed Care, Other (non HMO) | Admitting: Vascular Surgery

## 2021-06-09 ENCOUNTER — Encounter: Payer: Self-pay | Admitting: Vascular Surgery

## 2021-06-09 ENCOUNTER — Ambulatory Visit (HOSPITAL_COMMUNITY)
Admission: RE | Admit: 2021-06-09 | Discharge: 2021-06-09 | Disposition: A | Discharge status: Home / Self Care | Payer: Managed Care, Other (non HMO) | Source: Ambulatory Visit | Attending: Vascular Surgery | Admitting: Vascular Surgery

## 2021-06-09 VITALS — BP 146/75 | HR 59 | Temp 97.9°F | Resp 20 | Ht 71.0 in | Wt 230.0 lb

## 2021-06-09 DIAGNOSIS — M7989 Other specified soft tissue disorders: Secondary | ICD-10-CM | POA: Diagnosis not present

## 2021-06-09 DIAGNOSIS — I8393 Asymptomatic varicose veins of bilateral lower extremities: Secondary | ICD-10-CM

## 2021-06-09 NOTE — Progress Notes (Signed)
? ?Patient ID: Marc Faulkner, male   DOB: Feb 10, 1956, 66 y.o.   MRN: [id number] ? ?Reason for Consult: Follow-up ?  ?Referred by Nicoletta Dress, MD ? ?Subjective:  ?   ?HPI: ? ?Marc Faulkner is a 66 y.o. male with history of varicose veins status post right lower extremity laser ablation that was complicated by infection requiring hospital admission.  He does have a painful varicosity on the right knee.  Denies any history of DVT.  Significant family history of varicose veins include his mother, grandmother and grandfather.  He does endorse multiple bleeding episodes from the right lower extremity.  He was wearing knee-high compressions and most recent evaluation was discussed he would need thigh-high compression stockings.  He has continued to wear the knee-high stockings.  He does walk up to 20,000 steps daily and at the end of the day he has significant discomfort in his right lower extremity particularly and swelling even when wearing his knee-high stockings.  He also has discomfort around the right medial thigh and right knee where he has a large varicosity.  He has not had any recent bleeding episodes since last evaluation.  Left lower extremity also has some varicosities and telangiectasias but he denies swelling and most of the discomfort in that leg is related to a remote ankle fracture. ? ?Past Medical History:  ?Diagnosis Date  ? Allergic rhinitis, seasonal   ? Benign prostatic hyperplasia with lower urinary tract symptoms   ? Bleeding external hemorrhoids   ? Candidal balanitis   ? Chronic sinusitis   ? DDD (degenerative disc disease), cervical   ? DDD (degenerative disc disease), thoracic   ? Esophageal reflux 07/23/2020  ? Fracture of finger, distal phalanx, right, open   ? Fracture of left ankle   ? Gastric polyp   ? Gastritis   ? GERD (gastroesophageal reflux disease)   ? Hearing loss 07/23/2020  ? Hernia, umbilical   ? Hypercholesteremia 07/23/2020  ? Hyperlipidemia   ? Impotence of organic origin    ? Ingrown nail 12/15/2016  ? Lumbar spondylolysis   ? Neural foraminal stenosis of cervical spine   ? Osteoarthritis 07/23/2020  ? Plantar fasciitis 12/15/2016  ? Rosacea   ? Tightness of heel cord, left 12/29/2016  ? 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 ?Family History  ?Problem Relation Age of Onset  ? Ovarian cancer Mother   ? Diabetes Mother   ? Rheumatologic disease Mother   ? Leukemia Mother   ? Heart attack Mother   ? Bladder Cancer Mother   ? Heart disease Maternal Grandmother   ? ?Past Surgical History:  ?Procedure Laterality Date  ? CHOLECYSTECTOMY    ? GALLBLADDER SURGERY    ? HEMORRHOID SURGERY    ? HERNIA REPAIR    ? umbilical   ? INGUINAL HERNIA REPAIR Left   ? ROTATOR CUFF REPAIR Right   ? VEIN LIGATION AND STRIPPING    ? ? ?Short Social History:  ?Social History  ? ?Tobacco Use  ? Smoking status: Never  ? Smokeless tobacco: Never  ?Substance Use Topics  ? Alcohol use: Not Currently  ? ? ?Allergies  ?Allergen Reactions  ? Sulfa Antibiotics Nausea And Vomiting  ?  Fever  ? ? ?Current Outpatient Medications  ?Medication Sig Dispense Refill  ? celecoxib (CELEBREX) 200 MG capsule Take 200 mg by mouth 2 (two) times daily.     ? cetirizine (ZYRTEC) 10 MG tablet Take 10 mg by mouth daily.     ?  cyclobenzaprine (FLEXERIL) 10 MG tablet Take 1 tablet by mouth 2 (two) times daily as needed for muscle spasms.    ? doxycycline (VIBRA-TABS) 100 MG tablet Take 100 mg by mouth daily.     ? fenofibrate 160 MG tablet Take 160 mg by mouth daily.     ? metroNIDAZOLE (METROGEL) 1 % gel Apply 1 application topically 2 (two) times daily as needed (rosacea).    ? omeprazole (PRILOSEC) 20 MG capsule Take 20 mg by mouth 2 (two) times daily before a meal.    ? tamsulosin (FLOMAX) 0.4 MG CAPS capsule Take 0.4 mg by mouth daily.    ? ?No current facility-administered medications for this visit.  ? ? ?Review of Systems  ?Constitutional:  Constitutional negative. ?HENT: HENT negative.  ?Eyes: Eyes negative.   ?Respiratory: Respiratory negative.  ?Cardiovascular: Positive for leg swelling.  ?GI: Gastrointestinal negative.  ?Musculoskeletal: Positive for leg pain.  ?Skin: Skin negative.  ?Neurological: Neurological negative. ?Hematologic: Hematologic/lymphatic negative.  ?Psychiatric: Psychiatric negative.   ? ?   ?Objective:  ?Objective  ? ?Vitals:  ? 06/09/21 0950  ?BP: (!) 146/75  ?Pulse: (!) 59  ?Resp: 20  ?Temp: 97.9 ?F (36.6 ?C)  ?SpO2: 96%  ? ? ?Physical Exam ?HENT:  ?   Head: Normocephalic.  ?   Nose: Nose normal.  ?Eyes:  ?   Pupils: Pupils are equal, round, and reactive to light.  ?Cardiovascular:  ?   Rate and Rhythm: Normal rate.  ?   Pulses: Normal pulses.  ?Pulmonary:  ?   Effort: Pulmonary effort is normal.  ?Abdominal:  ?   General: Abdomen is flat.  ?   Palpations: Abdomen is soft.  ?Musculoskeletal:     ?   General: Normal range of motion.  ?   Cervical back: Normal range of motion.  ?   Right lower leg: Edema present.  ?   Left lower leg: No edema.  ?Skin: ?   General: Skin is warm.  ?   Capillary Refill: Capillary refill takes less than 2 seconds.  ?Neurological:  ?   General: No focal deficit present.  ?   Mental Status: He is alert.  ?Psychiatric:     ?   Mood and Affect: Mood normal.     ?   Behavior: Behavior normal.  ? ? ? ?Data: ?Venous Reflux Times  ?+--------------+---------+------+-----------+------------+-----------------  ?+  ?RIGHT         Reflux NoRefluxReflux TimeDiameter cmsComments          ?  ?                        Yes                                           ?  ?+--------------+---------+------+-----------+------------+-----------------  ?+  ?CFV                     yes   >1 second                               ?  ?+--------------+---------+------+-----------+------------+-----------------  ?+  ?FV mid                  yes   >1 second                               ?  ?+--------------+---------+------+-----------+------------+-----------------  ?+   ?  Popliteal               yes   >1 second                               ?  ?+--------------+---------+------+-----------+------------+-----------------  ?+  ?GSV at SFJ              yes    >500 ms      0.73    residual  ?thrombus  ?+--------------+---------+------+-----------+------------+-----------------  ?+  ?GSV prox thigh          yes    >500 ms      0.89    varicosities      ?  ?+--------------+---------+------+-----------+------------+-----------------  ?+  ?GSV mid thigh                                       varicosities      ?  ?+--------------+---------+------+-----------+------------+-----------------  ?+  ?GSV dist thigh                                      varicosities      ?  ?+--------------+---------+------+-----------+------------+-----------------  ?+  ?GSV at knee                                         NV, thrombosed  ?vv  ?+--------------+---------+------+-----------+------------+-----------------  ?+  ?GSV prox calf no                            0.18                      ?  ?+--------------+---------+------+-----------+------------+-----------------  ?+  ?GSV mid calf            yes    >500 ms      0.40                      ?  ?+--------------+---------+------+-----------+------------+-----------------  ?+  ?SSV Pop Fossa no                            0.24                      ?  ?+--------------+---------+------+-----------+------------+-----------------  ?+  ?SSV prox calf no                            0.36                      ?  ?+--------------+---------+------+-----------+------------+-----------------  ?+  ?SSV mid calf  no                            0.19    varicosities      ?  ?+--------------+---------+------+-----------+------------+-----------------  ?+  ?AASV prx  NV                ?   ?+--------------+---------+------+-----------+------------+-----------------  ?+  ? ?   ?+--------------+---------+------+-----------+------------+--------+  ?LEFT          Reflux NoRefluxReflux TimeDiameter cmsComments  ?                        Yes                                   ?+--------------+---------+------+-----------+------------+--------+  ?CFV                     yes   >1 second

## 2021-08-03 DIAGNOSIS — M1711 Unilateral primary osteoarthritis, right knee: Secondary | ICD-10-CM | POA: Diagnosis not present

## 2021-08-30 DIAGNOSIS — M722 Plantar fascial fibromatosis: Secondary | ICD-10-CM | POA: Diagnosis not present

## 2021-08-30 DIAGNOSIS — M12572 Traumatic arthropathy, left ankle and foot: Secondary | ICD-10-CM | POA: Diagnosis not present

## 2021-08-30 DIAGNOSIS — M25572 Pain in left ankle and joints of left foot: Secondary | ICD-10-CM | POA: Diagnosis not present

## 2021-08-30 DIAGNOSIS — M79672 Pain in left foot: Secondary | ICD-10-CM | POA: Diagnosis not present

## 2021-09-08 ENCOUNTER — Encounter: Payer: Self-pay | Admitting: Vascular Surgery

## 2021-09-08 ENCOUNTER — Ambulatory Visit (INDEPENDENT_AMBULATORY_CARE_PROVIDER_SITE_OTHER): Payer: PPO | Admitting: Vascular Surgery

## 2021-09-08 VITALS — BP 150/79 | HR 59 | Temp 97.8°F | Resp 20 | Ht 71.0 in | Wt 228.0 lb

## 2021-09-08 DIAGNOSIS — I8393 Asymptomatic varicose veins of bilateral lower extremities: Secondary | ICD-10-CM | POA: Diagnosis not present

## 2021-09-08 NOTE — Progress Notes (Signed)
Patient ID: Marc Faulkner, male   DOB: Feb 08, 1956, 66 y.o.   MRN: [id number]  Reason for Consult: Follow-up   Referred by Nicoletta Dress, MD  Subjective:     HPI:  Marc Faulkner is a 66 y.o. male with a history of previous great saphenous vein ablation many years ago that was complicated by infection.  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  He has not had any bleeding or clotting issues.  He denies any tissue loss or ulceration of the leg.  Past Medical History:  Diagnosis Date   Allergic rhinitis, seasonal    Benign prostatic hyperplasia with lower urinary tract symptoms    Bleeding external hemorrhoids    Candidal balanitis    Chronic sinusitis    DDD (degenerative disc disease), cervical    DDD (degenerative disc disease), thoracic    Esophageal reflux 07/23/2020   Fracture of finger, distal phalanx, right, open    Fracture of left ankle    Gastric polyp    Gastritis    GERD (gastroesophageal reflux disease)    Hearing loss 28/36/6294   Hernia, umbilical    Hypercholesteremia 07/23/2020   Hyperlipidemia    Impotence of organic origin    Ingrown nail 12/15/2016   Lumbar spondylolysis    Neural foraminal stenosis of cervical spine    Osteoarthritis 07/23/2020   Plantar fasciitis 12/15/2016   Rosacea    Tightness of heel cord, left 12/29/2016   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   Family History  Problem Relation Age of Onset   Ovarian cancer Mother    Diabetes Mother    Rheumatologic disease Mother    Leukemia Mother    Heart attack Mother    Bladder Cancer Mother    Heart disease Maternal Grandmother    Past Surgical History:  Procedure Laterality Date   CHOLECYSTECTOMY     GALLBLADDER SURGERY     HEMORRHOID SURGERY     HERNIA REPAIR     umbilical    INGUINAL HERNIA REPAIR Left    ROTATOR CUFF REPAIR Right    VEIN LIGATION AND  STRIPPING      Short Social History:  Social History   Tobacco Use   Smoking status: Never   Smokeless tobacco: Never  Substance Use Topics   Alcohol use: Not Currently    Allergies  Allergen Reactions   Sulfa Antibiotics Nausea And Vomiting    Fever    Current Outpatient Medications  Medication Sig Dispense Refill   celecoxib (CELEBREX) 200 MG capsule Take 200 mg by mouth 2 (two) times daily.      cetirizine (ZYRTEC) 10 MG tablet Take 10 mg by mouth daily.      cyclobenzaprine (FLEXERIL) 10 MG tablet Take 1 tablet by mouth 2 (two) times daily as needed for muscle spasms.     doxycycline (VIBRA-TABS) 100 MG tablet Take 100 mg by mouth daily.      fenofibrate 160 MG tablet Take 160 mg by mouth daily.      gabapentin (NEURONTIN) 300 MG capsule gabapentin 300 mg capsule     methocarbamol (ROBAXIN) 500 MG tablet Take by mouth daily.     metroNIDAZOLE (METROGEL) 1 % gel Apply 1 application topically 2 (two) times daily as needed (rosacea).     omeprazole (PRILOSEC) 20 MG capsule Take 20 mg by mouth 2 (two) times daily before a  meal.     predniSONE (DELTASONE) 5 MG tablet Take by mouth.     tamsulosin (FLOMAX) 0.4 MG CAPS capsule Take 0.4 mg by mouth daily.     tiZANidine (ZANAFLEX) 4 MG tablet tizanidine 4 mg tablet     No current facility-administered medications for this visit.    Review of Systems  Constitutional:  Constitutional negative. HENT: HENT negative.  Eyes: Eyes negative.  Respiratory: Respiratory negative.  Cardiovascular: Positive for leg swelling.  GI: Gastrointestinal negative.  Musculoskeletal: Positive for leg pain.  Skin: Skin negative.  Neurological: Neurological negative. Hematologic: Hematologic/lymphatic negative.  Psychiatric: Psychiatric negative.        Objective:  Objective   Vitals:   09/08/21 1101  BP: (!) 150/79  Pulse: (!) 59  Resp: 20  Temp: 97.8 F (36.6 C)  SpO2: 96%  Weight: 228 lb (103.4 kg)  Height: '5\' 11"'$  (1.803 m)    Body mass index is 31.8 kg/m.  Physical Exam HENT:     Head: Normocephalic.     Nose: Nose normal.  Eyes:     Pupils: Pupils are equal, round, and reactive to light.  Cardiovascular:     Rate and Rhythm: Normal rate.     Pulses: Normal pulses.  Pulmonary:     Effort: Pulmonary effort is normal.  Abdominal:     General: Abdomen is flat.     Palpations: Abdomen is soft.  Musculoskeletal:     Cervical back: Normal range of motion.     Right lower leg: Edema present.     Left lower leg: No edema.  Skin:    General: Skin is warm and dry.     Capillary Refill: Capillary refill takes less than 2 seconds.  Neurological:     General: No focal deficit present.     Mental Status: He is alert.  Psychiatric:        Mood and Affect: Mood normal.      Data: Venous Reflux Times  +--------------+---------+------+-----------+------------+-----------------  +  RIGHT         Reflux NoRefluxReflux TimeDiameter cmsComments                                    Yes                                             +--------------+---------+------+-----------+------------+-----------------  +  CFV                     yes   >1 second                                 +--------------+---------+------+-----------+------------+-----------------  +  FV mid                  yes   >1 second                                 +--------------+---------+------+-----------+------------+-----------------  +  Popliteal               yes   >1 second                                 +--------------+---------+------+-----------+------------+-----------------  +  GSV at [facility]              yes    >500 ms      0.73    residual  thrombus  +--------------+---------+------+-----------+------------+-----------------  +  GSV prox thigh          yes    >500 ms      0.89    varicosities        +--------------+---------+------+-----------+------------+-----------------   +  GSV mid thigh                                       varicosities        +--------------+---------+------+-----------+------------+-----------------  +  GSV dist thigh                                      varicosities        +--------------+---------+------+-----------+------------+-----------------  +  GSV at knee                                         NV, thrombosed  vv  +--------------+---------+------+-----------+------------+-----------------  +  GSV prox calf no                            0.18                        +--------------+---------+------+-----------+------------+-----------------  +  GSV mid calf            yes    >500 ms      0.40                        +--------------+---------+------+-----------+------------+-----------------  +  SSV Pop Fossa no                            0.24                        +--------------+---------+------+-----------+------------+-----------------  +  SSV prox calf no                            0.36                        +--------------+---------+------+-----------+------------+-----------------  +  SSV mid calf  no                            0.19    varicosities        +--------------+---------+------+-----------+------------+-----------------  +  AASV prx                                            NV                  +--------------+---------+------+-----------+------------+-----------------  +          Assessment/Plan:    66 year old male with history as noted above  with extensive left lower extremity varicosities and C3 venous disease with swelling of the right really no swelling of the left lower extremity.  We discussed his options being continued compression stockings versus great saphenous vein ablation of the small residual saphenous vein and then extensive stab phlebectomy.  At this time patient would like to avoid any further procedures as he  continues to work 2 jobs I certainly think this is reasonable.  He will continue compression stockings which he has been wearing religiously and will call us on an as-needed basis.     Waynetta Sandy MD [facility]

## 2021-09-22 DIAGNOSIS — J302 Other seasonal allergic rhinitis: Secondary | ICD-10-CM | POA: Diagnosis not present

## 2021-09-22 DIAGNOSIS — E785 Hyperlipidemia, unspecified: Secondary | ICD-10-CM | POA: Diagnosis not present

## 2021-12-16 DIAGNOSIS — M12572 Traumatic arthropathy, left ankle and foot: Secondary | ICD-10-CM | POA: Diagnosis not present

## 2021-12-20 DIAGNOSIS — R7301 Impaired fasting glucose: Secondary | ICD-10-CM | POA: Diagnosis not present

## 2021-12-20 DIAGNOSIS — L719 Rosacea, unspecified: Secondary | ICD-10-CM | POA: Diagnosis not present

## 2021-12-20 DIAGNOSIS — K219 Gastro-esophageal reflux disease without esophagitis: Secondary | ICD-10-CM | POA: Diagnosis not present

## 2021-12-20 DIAGNOSIS — Z6832 Body mass index (BMI) 32.0-32.9, adult: Secondary | ICD-10-CM | POA: Diagnosis not present

## 2021-12-20 DIAGNOSIS — Z9181 History of falling: Secondary | ICD-10-CM | POA: Diagnosis not present

## 2021-12-20 DIAGNOSIS — K449 Diaphragmatic hernia without obstruction or gangrene: Secondary | ICD-10-CM | POA: Diagnosis not present

## 2021-12-20 DIAGNOSIS — E785 Hyperlipidemia, unspecified: Secondary | ICD-10-CM | POA: Diagnosis not present

## 2021-12-20 DIAGNOSIS — Z1331 Encounter for screening for depression: Secondary | ICD-10-CM | POA: Diagnosis not present

## 2021-12-20 DIAGNOSIS — Z23 Encounter for immunization: Secondary | ICD-10-CM | POA: Diagnosis not present

## 2022-01-04 DIAGNOSIS — Z23 Encounter for immunization: Secondary | ICD-10-CM | POA: Diagnosis not present

## 2022-01-07 DIAGNOSIS — M1711 Unilateral primary osteoarthritis, right knee: Secondary | ICD-10-CM | POA: Diagnosis not present

## 2022-01-08 DIAGNOSIS — M25511 Pain in right shoulder: Secondary | ICD-10-CM | POA: Diagnosis not present

## 2022-01-08 DIAGNOSIS — J302 Other seasonal allergic rhinitis: Secondary | ICD-10-CM | POA: Diagnosis not present

## 2022-01-08 DIAGNOSIS — S161XXA Strain of muscle, fascia and tendon at neck level, initial encounter: Secondary | ICD-10-CM | POA: Diagnosis not present

## 2022-01-08 DIAGNOSIS — Z6833 Body mass index (BMI) 33.0-33.9, adult: Secondary | ICD-10-CM | POA: Diagnosis not present

## 2022-01-08 DIAGNOSIS — M25512 Pain in left shoulder: Secondary | ICD-10-CM | POA: Diagnosis not present

## 2022-02-24 DIAGNOSIS — M5416 Radiculopathy, lumbar region: Secondary | ICD-10-CM | POA: Diagnosis not present

## 2022-02-24 DIAGNOSIS — M25512 Pain in left shoulder: Secondary | ICD-10-CM | POA: Diagnosis not present

## 2022-03-01 DIAGNOSIS — M542 Cervicalgia: Secondary | ICD-10-CM | POA: Diagnosis not present

## 2022-03-01 DIAGNOSIS — M545 Low back pain, unspecified: Secondary | ICD-10-CM | POA: Diagnosis not present

## 2022-03-07 DIAGNOSIS — H524 Presbyopia: Secondary | ICD-10-CM | POA: Diagnosis not present

## 2022-03-07 DIAGNOSIS — H40003 Preglaucoma, unspecified, bilateral: Secondary | ICD-10-CM | POA: Diagnosis not present

## 2022-03-15 DIAGNOSIS — M542 Cervicalgia: Secondary | ICD-10-CM | POA: Diagnosis not present

## 2022-03-17 DIAGNOSIS — M5489 Other dorsalgia: Secondary | ICD-10-CM | POA: Diagnosis not present

## 2022-03-17 DIAGNOSIS — R2689 Other abnormalities of gait and mobility: Secondary | ICD-10-CM | POA: Diagnosis not present

## 2022-03-17 DIAGNOSIS — M6281 Muscle weakness (generalized): Secondary | ICD-10-CM | POA: Diagnosis not present

## 2022-03-21 DIAGNOSIS — M5489 Other dorsalgia: Secondary | ICD-10-CM | POA: Diagnosis not present

## 2022-03-21 DIAGNOSIS — M6281 Muscle weakness (generalized): Secondary | ICD-10-CM | POA: Diagnosis not present

## 2022-03-21 DIAGNOSIS — R2689 Other abnormalities of gait and mobility: Secondary | ICD-10-CM | POA: Diagnosis not present

## 2022-03-24 DIAGNOSIS — R2689 Other abnormalities of gait and mobility: Secondary | ICD-10-CM | POA: Diagnosis not present

## 2022-03-24 DIAGNOSIS — M5489 Other dorsalgia: Secondary | ICD-10-CM | POA: Diagnosis not present

## 2022-03-24 DIAGNOSIS — M6281 Muscle weakness (generalized): Secondary | ICD-10-CM | POA: Diagnosis not present

## 2022-03-28 DIAGNOSIS — M6281 Muscle weakness (generalized): Secondary | ICD-10-CM | POA: Diagnosis not present

## 2022-03-28 DIAGNOSIS — M5489 Other dorsalgia: Secondary | ICD-10-CM | POA: Diagnosis not present

## 2022-03-28 DIAGNOSIS — R2689 Other abnormalities of gait and mobility: Secondary | ICD-10-CM | POA: Diagnosis not present

## 2022-03-28 DIAGNOSIS — M542 Cervicalgia: Secondary | ICD-10-CM | POA: Diagnosis not present

## 2022-03-28 DIAGNOSIS — M5451 Vertebrogenic low back pain: Secondary | ICD-10-CM | POA: Diagnosis not present

## 2022-05-11 DIAGNOSIS — M4722 Other spondylosis with radiculopathy, cervical region: Secondary | ICD-10-CM | POA: Diagnosis not present

## 2022-05-13 DIAGNOSIS — M542 Cervicalgia: Secondary | ICD-10-CM | POA: Diagnosis not present

## 2022-05-13 DIAGNOSIS — M546 Pain in thoracic spine: Secondary | ICD-10-CM | POA: Diagnosis not present

## 2022-05-18 DIAGNOSIS — M546 Pain in thoracic spine: Secondary | ICD-10-CM | POA: Diagnosis not present

## 2022-05-18 DIAGNOSIS — M542 Cervicalgia: Secondary | ICD-10-CM | POA: Diagnosis not present

## 2022-05-31 DIAGNOSIS — M1711 Unilateral primary osteoarthritis, right knee: Secondary | ICD-10-CM | POA: Diagnosis not present

## 2022-06-20 DIAGNOSIS — Z6833 Body mass index (BMI) 33.0-33.9, adult: Secondary | ICD-10-CM | POA: Diagnosis not present

## 2022-06-20 DIAGNOSIS — K219 Gastro-esophageal reflux disease without esophagitis: Secondary | ICD-10-CM | POA: Diagnosis not present

## 2022-06-20 DIAGNOSIS — Z125 Encounter for screening for malignant neoplasm of prostate: Secondary | ICD-10-CM | POA: Diagnosis not present

## 2022-06-20 DIAGNOSIS — L719 Rosacea, unspecified: Secondary | ICD-10-CM | POA: Diagnosis not present

## 2022-06-20 DIAGNOSIS — K449 Diaphragmatic hernia without obstruction or gangrene: Secondary | ICD-10-CM | POA: Diagnosis not present

## 2022-06-20 DIAGNOSIS — R7301 Impaired fasting glucose: Secondary | ICD-10-CM | POA: Diagnosis not present

## 2022-06-20 DIAGNOSIS — E785 Hyperlipidemia, unspecified: Secondary | ICD-10-CM | POA: Diagnosis not present

## 2022-07-13 DIAGNOSIS — H40003 Preglaucoma, unspecified, bilateral: Secondary | ICD-10-CM | POA: Diagnosis not present

## 2022-08-31 DIAGNOSIS — K130 Diseases of lips: Secondary | ICD-10-CM | POA: Diagnosis not present

## 2022-08-31 DIAGNOSIS — K120 Recurrent oral aphthae: Secondary | ICD-10-CM | POA: Diagnosis not present

## 2022-10-14 DIAGNOSIS — K130 Diseases of lips: Secondary | ICD-10-CM | POA: Diagnosis not present

## 2022-10-17 DIAGNOSIS — M1711 Unilateral primary osteoarthritis, right knee: Secondary | ICD-10-CM | POA: Diagnosis not present

## 2022-12-12 DIAGNOSIS — M5134 Other intervertebral disc degeneration, thoracic region: Secondary | ICD-10-CM | POA: Diagnosis not present

## 2022-12-12 DIAGNOSIS — M5430 Sciatica, unspecified side: Secondary | ICD-10-CM | POA: Diagnosis not present

## 2022-12-12 DIAGNOSIS — J309 Allergic rhinitis, unspecified: Secondary | ICD-10-CM | POA: Diagnosis not present

## 2022-12-15 DIAGNOSIS — D485 Neoplasm of uncertain behavior of skin: Secondary | ICD-10-CM | POA: Diagnosis not present

## 2022-12-15 DIAGNOSIS — L814 Other melanin hyperpigmentation: Secondary | ICD-10-CM | POA: Diagnosis not present

## 2022-12-15 DIAGNOSIS — D225 Melanocytic nevi of trunk: Secondary | ICD-10-CM | POA: Diagnosis not present

## 2022-12-15 DIAGNOSIS — L820 Inflamed seborrheic keratosis: Secondary | ICD-10-CM | POA: Diagnosis not present

## 2022-12-15 DIAGNOSIS — D2239 Melanocytic nevi of other parts of face: Secondary | ICD-10-CM | POA: Diagnosis not present

## 2022-12-15 DIAGNOSIS — L918 Other hypertrophic disorders of the skin: Secondary | ICD-10-CM | POA: Diagnosis not present

## 2022-12-20 DIAGNOSIS — I700 Atherosclerosis of aorta: Secondary | ICD-10-CM | POA: Diagnosis not present

## 2022-12-20 DIAGNOSIS — Z23 Encounter for immunization: Secondary | ICD-10-CM | POA: Diagnosis not present

## 2022-12-20 DIAGNOSIS — E785 Hyperlipidemia, unspecified: Secondary | ICD-10-CM | POA: Diagnosis not present

## 2022-12-20 DIAGNOSIS — L719 Rosacea, unspecified: Secondary | ICD-10-CM | POA: Diagnosis not present

## 2022-12-20 DIAGNOSIS — Z6833 Body mass index (BMI) 33.0-33.9, adult: Secondary | ICD-10-CM | POA: Diagnosis not present

## 2022-12-20 DIAGNOSIS — K449 Diaphragmatic hernia without obstruction or gangrene: Secondary | ICD-10-CM | POA: Diagnosis not present

## 2022-12-20 DIAGNOSIS — R7301 Impaired fasting glucose: Secondary | ICD-10-CM | POA: Diagnosis not present

## 2022-12-20 DIAGNOSIS — K219 Gastro-esophageal reflux disease without esophagitis: Secondary | ICD-10-CM | POA: Diagnosis not present

## 2023-01-26 ENCOUNTER — Other Ambulatory Visit: Payer: Self-pay

## 2023-01-26 DIAGNOSIS — I8393 Asymptomatic varicose veins of bilateral lower extremities: Secondary | ICD-10-CM

## 2023-01-26 DIAGNOSIS — M7989 Other specified soft tissue disorders: Secondary | ICD-10-CM

## 2023-02-06 DIAGNOSIS — Z0000 Encounter for general adult medical examination without abnormal findings: Secondary | ICD-10-CM | POA: Diagnosis not present

## 2023-02-06 DIAGNOSIS — Z9181 History of falling: Secondary | ICD-10-CM | POA: Diagnosis not present

## 2023-02-09 DIAGNOSIS — M1711 Unilateral primary osteoarthritis, right knee: Secondary | ICD-10-CM | POA: Diagnosis not present

## 2023-02-09 DIAGNOSIS — M5416 Radiculopathy, lumbar region: Secondary | ICD-10-CM | POA: Diagnosis not present

## 2023-02-10 DIAGNOSIS — M5451 Vertebrogenic low back pain: Secondary | ICD-10-CM | POA: Diagnosis not present

## 2023-02-10 DIAGNOSIS — M5459 Other low back pain: Secondary | ICD-10-CM | POA: Diagnosis not present

## 2023-02-10 DIAGNOSIS — M542 Cervicalgia: Secondary | ICD-10-CM | POA: Diagnosis not present

## 2023-03-10 DIAGNOSIS — M5459 Other low back pain: Secondary | ICD-10-CM | POA: Diagnosis not present

## 2023-03-14 DIAGNOSIS — B9689 Other specified bacterial agents as the cause of diseases classified elsewhere: Secondary | ICD-10-CM | POA: Diagnosis not present

## 2023-03-14 DIAGNOSIS — J019 Acute sinusitis, unspecified: Secondary | ICD-10-CM | POA: Diagnosis not present

## 2023-03-15 ENCOUNTER — Ambulatory Visit: Payer: PPO | Admitting: Vascular Surgery

## 2023-03-15 ENCOUNTER — Encounter: Payer: Self-pay | Admitting: Vascular Surgery

## 2023-03-15 ENCOUNTER — Ambulatory Visit (HOSPITAL_COMMUNITY)
Admission: RE | Admit: 2023-03-15 | Discharge: 2023-03-15 | Disposition: A | Discharge status: Home / Self Care | Payer: PPO | Source: Ambulatory Visit | Attending: Vascular Surgery | Admitting: Vascular Surgery

## 2023-03-15 VITALS — BP 158/78 | HR 70 | Temp 98.2°F | Resp 20 | Ht 71.0 in | Wt 236.0 lb

## 2023-03-15 DIAGNOSIS — M7989 Other specified soft tissue disorders: Secondary | ICD-10-CM | POA: Insufficient documentation

## 2023-03-15 DIAGNOSIS — I83811 Varicose veins of right lower extremities with pain: Secondary | ICD-10-CM | POA: Diagnosis not present

## 2023-03-15 DIAGNOSIS — I8393 Asymptomatic varicose veins of bilateral lower extremities: Secondary | ICD-10-CM | POA: Diagnosis not present

## 2023-03-15 NOTE — Progress Notes (Signed)
Patient ID: Marc Faulkner, male   DOB: 1955-04-20, 68 y.o.   MRN: [id number]  Reason for Consult: Follow-up   Referred by Paulina Fusi, MD  Subjective:     HPI:  Marc Faulkner is a 68 y.o. male followed here for extensive varicosities of his right lower extremity with previous great saphenous vein ablation complicated by infection.  Patient now has recurrent right lower extremity swelling and multiple areas of varicosities.  He has had 2 episodes of bleeding in the past but the most recent was a couple years ago.  He has been compliant with thigh-high compression stockings.  He is here to discuss surgical options.  He also has right knee pain and is being considered for right knee replacement in the future.  Past Medical History:  Diagnosis Date   Allergic rhinitis, seasonal    Benign prostatic hyperplasia with lower urinary tract symptoms    Bleeding external hemorrhoids    Candidal balanitis    Chronic sinusitis    DDD (degenerative disc disease), cervical    DDD (degenerative disc disease), thoracic    Esophageal reflux 07/23/2020   Fracture of finger, distal phalanx, right, open    Fracture of left ankle    Gastric polyp    Gastritis    GERD (gastroesophageal reflux disease)    Hearing loss 07/23/2020   Hernia, umbilical    Hypercholesteremia 07/23/2020   Hyperlipidemia    Impotence of organic origin    Ingrown nail 12/15/2016   Lumbar spondylolysis    Neural foraminal stenosis of cervical spine    Osteoarthritis 07/23/2020   Plantar fasciitis 12/15/2016   Rosacea    Tightness of heel cord, left 12/29/2016   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   Family History  Problem Relation Age of Onset   Ovarian cancer Mother    Diabetes Mother    Rheumatologic disease Mother    Leukemia Mother    Heart attack Mother    Bladder Cancer Mother    Heart disease Maternal Grandmother    Past Surgical History:  Procedure Laterality Date   CHOLECYSTECTOMY      GALLBLADDER SURGERY     HEMORRHOID SURGERY     HERNIA REPAIR     umbilical    INGUINAL HERNIA REPAIR Left    ROTATOR CUFF REPAIR Right    VEIN LIGATION AND STRIPPING      Short Social History:  Social History   Tobacco Use   Smoking status: Never   Smokeless tobacco: Never  Substance Use Topics   Alcohol use: Not Currently    Allergies  Allergen Reactions   Sulfa Antibiotics Nausea And Vomiting    Fever    Current Outpatient Medications  Medication Sig Dispense Refill   celecoxib (CELEBREX) 200 MG capsule Take 200 mg by mouth 2 (two) times daily.      cetirizine (ZYRTEC) 10 MG tablet Take 10 mg by mouth daily.      cyclobenzaprine (FLEXERIL) 10 MG tablet Take 1 tablet by mouth 2 (two) times daily as needed for muscle spasms.     doxycycline (VIBRA-TABS) 100 MG tablet Take 100 mg by mouth daily.      fenofibrate 160 MG tablet Take 160 mg by mouth daily.      gabapentin (NEURONTIN) 300 MG capsule gabapentin 300 mg capsule     methocarbamol (ROBAXIN) 500 MG tablet Take by mouth daily.     metroNIDAZOLE (METROGEL) 1 % gel Apply 1 application topically 2 (two) times  daily as needed (rosacea).     omeprazole (PRILOSEC) 20 MG capsule Take 20 mg by mouth 2 (two) times daily before a meal.     predniSONE (DELTASONE) 5 MG tablet Take by mouth.     tamsulosin (FLOMAX) 0.4 MG CAPS capsule Take 0.4 mg by mouth daily.     tiZANidine (ZANAFLEX) 4 MG tablet tizanidine 4 mg tablet     No current facility-administered medications for this visit.    Review of Systems  Constitutional:  Constitutional negative. HENT: HENT negative.  Eyes: Eyes negative.  Respiratory: Respiratory negative.  Cardiovascular: Positive for leg swelling.  GI: Gastrointestinal negative.  Musculoskeletal: Positive for gait problem, leg pain and joint pain.  Hematologic: Positive for bruises/bleeds easily.  Psychiatric: Psychiatric negative.        Objective:  Objective   Vitals:   03/15/23 1506  BP:  (!) 158/78  Pulse: 70  Resp: 20  Temp: 98.2 F (36.8 C)  SpO2: 94%  Weight: 236 lb (107 kg)  Height: 5\' 11"  (1.803 m)   Body mass index is 32.92 kg/m.  Physical Exam HENT:     Head: Normocephalic.  Eyes:     Pupils: Pupils are equal, round, and reactive to light.  Cardiovascular:     Pulses: Normal pulses.  Pulmonary:     Effort: Pulmonary effort is normal.  Abdominal:     General: Abdomen is flat.  Musculoskeletal:     Cervical back: Normal range of motion.     Right lower leg: Edema present.     Left lower leg: No edema.  Skin:    General: Skin is warm.     Capillary Refill: Capillary refill takes less than 2 seconds.  Neurological:     General: No focal deficit present.     Mental Status: He is alert.        Data: Venous Reflux Times  +---------------+---------+------+-----------+------------+----------------  ----+  RIGHT         Reflux NoRefluxReflux TimeDiameter cmsComments                                        Yes                                                +---------------+---------+------+-----------+------------+----------------  ----+  CFV                     yes   >1 second                                    +---------------+---------+------+-----------+------------+----------------  ----+  FV prox                  yes   >1 second                                    +---------------+---------+------+-----------+------------+----------------  ----+  FV mid                   yes   >1 second                                    +---------------+---------+------+-----------+------------+----------------  ----+  FV dist                  yes   >1 second                                    +---------------+---------+------+-----------+------------+----------------  ----+  Popliteal               yes   >1 second                                     +---------------+---------+------+-----------+------------+----------------  ----+  GSV at [facility]               yes    >500 ms      0.87                           +---------------+---------+------+-----------+------------+----------------  ----+  GSV prox thigh           yes    >500 ms      0.71                           +---------------+---------+------+-----------+------------+----------------  ----+  GSV mid thigh            yes    >500 ms      0.85     Varicose/Tortuous     +---------------+---------+------+-----------+------------+----------------  ----+  GSV dist thigh           yes    >500 ms      0.30     Varicose/Tortuous     +---------------+---------+------+-----------+------------+----------------  ----+  GSV at knee              yes    >500 ms      0.83     Varicose/Tortuous     +---------------+---------+------+-----------+------------+----------------  ----+  GSV prox calf            yes    >500 ms      0.46     Varicose/Tortuous     +---------------+---------+------+-----------+------------+----------------  ----+  GSV mid calf             yes    >500 ms      0.48     Varicose/Tortuous     +---------------+---------+------+-----------+------------+----------------  ----+  SSV Pop Fossa  no                            0.32                           +---------------+---------+------+-----------+------------+----------------  ----+  SSV prox calf  no                            0.40                           +---------------+---------+------+-----------+------------+----------------  ----+  SSV mid calf             yes    >500 ms      0.30    with associated  varicosities  (0.38,                                                        >500 ms reflux)         +---------------+---------+------+-----------+------------+----------------  ----+  Varicosities            yes    >500 ms      0.83                           Mid                                                                         thigh-distal                                                                calf                                                                        +---------------+---------+------+-----------+------------+----------------  ----+  Perforator              yes    >500 ms      0.73                           Distal Calf                                                                 +---------------+---------+------+-----------+------------+----------------  ----+     Summary:  Right:  - No evidence of deep vein thrombosis seen in the right lower extremity,  from the common femoral through the popliteal veins.  - No evidence of superficial venous thrombosis in the right lower  extremity.  - Deep venous reflux present in the common femoral, femoral and popliteal  veins.  - Superficial venous reflux present in the saphenofemoral junction, great  saphenous vein (thigh/knee/calf) and varicosities throughout entire right  leg.  - Superficial venous reflux present in the small saphenous vein mid calf  with associated varicosities.  - Venous reflux present at a distal calf perforator.      Assessment/Plan:    68 year old male with C3 right lower extremity venous disease with 2 episodes of associated bleeding and with pain and swelling of the right lower extremity and  symptoms overlying the multiple areas of varicosities.  He has been compliant with thigh-high compression stockings.  I evaluated his right lower extremity the bedside today with ultrasound he has a very large varicose great saphenous vein which we will plan for ablation and may need to cannulate this higher up on the thigh given the tortuosity  which would leave this approximately 10 cm to ablate where the vein is within the fascia throughout.  He would then need saphenectomy for greater than 20 and follow-up sclerotherap of at least 2 vials.  We have discussed the risk and benefits and alternatives and he demonstrates understanding we will get this scheduled in the near future.  Patient will need to continue to wear compression stockings perioperatively.     Maeola Harman MD [facility]

## 2023-03-16 DIAGNOSIS — M5451 Vertebrogenic low back pain: Secondary | ICD-10-CM | POA: Diagnosis not present

## 2023-03-21 DIAGNOSIS — H40003 Preglaucoma, unspecified, bilateral: Secondary | ICD-10-CM | POA: Diagnosis not present

## 2023-03-25 DIAGNOSIS — M5416 Radiculopathy, lumbar region: Secondary | ICD-10-CM | POA: Diagnosis not present

## 2023-03-30 DIAGNOSIS — M1711 Unilateral primary osteoarthritis, right knee: Secondary | ICD-10-CM | POA: Diagnosis not present

## 2023-04-12 DIAGNOSIS — B9689 Other specified bacterial agents as the cause of diseases classified elsewhere: Secondary | ICD-10-CM | POA: Diagnosis not present

## 2023-04-12 DIAGNOSIS — J019 Acute sinusitis, unspecified: Secondary | ICD-10-CM | POA: Diagnosis not present

## 2023-05-15 DIAGNOSIS — E785 Hyperlipidemia, unspecified: Secondary | ICD-10-CM | POA: Diagnosis not present

## 2023-05-15 DIAGNOSIS — Z79899 Other long term (current) drug therapy: Secondary | ICD-10-CM | POA: Diagnosis not present

## 2023-05-15 DIAGNOSIS — K219 Gastro-esophageal reflux disease without esophagitis: Secondary | ICD-10-CM | POA: Diagnosis not present

## 2023-05-15 DIAGNOSIS — R7301 Impaired fasting glucose: Secondary | ICD-10-CM | POA: Diagnosis not present

## 2023-05-15 DIAGNOSIS — R0789 Other chest pain: Secondary | ICD-10-CM | POA: Diagnosis not present

## 2023-05-15 DIAGNOSIS — K449 Diaphragmatic hernia without obstruction or gangrene: Secondary | ICD-10-CM | POA: Diagnosis not present

## 2023-05-30 DIAGNOSIS — M25661 Stiffness of right knee, not elsewhere classified: Secondary | ICD-10-CM | POA: Diagnosis not present

## 2023-05-30 DIAGNOSIS — M1711 Unilateral primary osteoarthritis, right knee: Secondary | ICD-10-CM | POA: Diagnosis not present

## 2023-05-30 DIAGNOSIS — M25561 Pain in right knee: Secondary | ICD-10-CM | POA: Diagnosis not present

## 2023-05-30 NOTE — H&P (Signed)
 TOTAL KNEE ADMISSION H&P  Patient is being admitted for right total knee arthroplasty.  Subjective:  Chief Complaint: Right knee pain.  HPI: Marc Faulkner, 68 y.o. male has a history of pain and functional disability in the right knee due to arthritis and has failed non-surgical conservative treatments for greater than 12 weeks to include NSAID's and/or analgesics, corticosteriod injections, and activity modification. Onset of symptoms was gradual, starting  several  years ago with gradually worsening course since that time. The patient noted no past surgery on the right knee.  Patient currently rates pain in the right knee at 9 out of 10 with activity. Patient has night pain, worsening of pain with activity and weight bearing, pain with passive range of motion, and crepitus. Patient has evidence of  bone-on-bone arthritis in the medial compartment right knee with patellofemoral involvement, slight varus deformity, and slight tibial subluxation  by imaging studies.  There is no active infection.  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    Past Medical History:  Diagnosis Date   Allergic rhinitis, seasonal    Benign prostatic hyperplasia with lower urinary tract symptoms    Bleeding external hemorrhoids    Candidal balanitis    Chronic sinusitis    DDD (degenerative disc disease), cervical    DDD (degenerative disc disease), thoracic    Esophageal reflux 07/23/2020   Fracture of finger, distal phalanx, right, open    Fracture of left ankle    Gastric polyp    Gastritis    GERD (gastroesophageal reflux disease)    Hearing loss 07/23/2020   Hernia, umbilical    Hypercholesteremia 07/23/2020   Hyperlipidemia     Impotence of organic origin    Ingrown nail 12/15/2016   Lumbar spondylolysis    Neural foraminal stenosis of cervical spine    Osteoarthritis 07/23/2020   Plantar fasciitis 12/15/2016   Rosacea    Tightness of heel cord, left 12/29/2016   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 Authorizing Provider  celecoxib (CELEBREX) 200 MG capsule Take 200 mg by mouth 2 (two) times daily.     [provider]  cetirizine (ZYRTEC) 10 MG tablet Take 10 mg by mouth daily.     [provider]  cyclobenzaprine (FLEXERIL) 10 MG tablet Take 1 tablet by mouth 2 (two) times daily as needed for muscle spasms. 07/20/20   [provider]  doxycycline (VIBRA-TABS) 100 MG tablet Take 100 mg by mouth daily.     [provider]  fenofibrate 160 MG tablet Take 160 mg by mouth daily.     [provider]  gabapentin (NEURONTIN) 300 MG capsule gabapentin 300 mg capsule    [provider]  methocarbamol (ROBAXIN) 500 MG tablet Take by mouth daily. 06/04/21   [provider]  metroNIDAZOLE (METROGEL) 1 % gel Apply 1 application topically 2 (two) times daily as needed (rosacea).    [provider]  omeprazole (PRILOSEC) 20 MG capsule Take 20 mg by mouth 2 (two) times daily before a meal.    [provider]  predniSONE (DELTASONE) 5 MG tablet Take by mouth. 05/11/21   [provider]  tamsulosin (FLOMAX) 0.4 MG CAPS capsule Take 0.4 mg by mouth daily. 06/01/20   [provider]  tiZANidine (ZANAFLEX) 4 MG tablet tizanidine 4 mg tablet    [provider]    Allergies  Allergen Reactions    Sulfa Antibiotics Nausea And Vomiting    Fever    Social History   Socioeconomic History   Marital status: Married    Spouse name: Not on file   Number of children: Not on file   Years of education: Not on file   Highest education level: Not on file  Occupational History   Not on file  Tobacco Use   Smoking status: Never   Smokeless tobacco: Never  Vaping Use   Vaping status: Never Used  Substance and Sexual Activity   Alcohol use: Not Currently   Drug use: Never   Sexual activity: Not on file  Other Topics Concern   Not on file  Social History Narrative   Not on file   Social Drivers of Health   Financial Resource Strain: Not on file  Food Insecurity: Not on file  Transportation Needs: Not on file  Physical Activity: Not on file  Stress: Not on file  Social Connections: Not on file  Intimate Partner Violence: Not on file    Tobacco Use: Low Risk  (03/15/2023)   Patient History    Smoking Tobacco Use: Never    Smokeless Tobacco Use: Never    Passive Exposure: Not on file   Social History   Substance and Sexual Activity  Alcohol Use Not Currently    Family History  Problem Relation Age of Onset   Ovarian cancer Mother    Diabetes Mother    Rheumatologic disease Mother    Leukemia Mother    Heart attack Mother    Bladder Cancer Mother    Heart disease Maternal Grandmother     Review of Systems  Constitutional:  Negative for chills and fever.  HENT:  Negative for congestion, sore throat and tinnitus.   Eyes:  Negative for double vision, photophobia and pain.  Respiratory:  Negative for cough, shortness of breath and wheezing.   Cardiovascular:  Negative for chest pain, palpitations and orthopnea.  Gastrointestinal:  Negative for heartburn, nausea and vomiting.  Genitourinary:  Negative for dysuria, frequency and urgency.  Musculoskeletal:  Positive for joint pain.  Neurological:  Negative for dizziness, weakness and headaches.     Objective:  Physical Exam: Well nourished and well developed.  General: Alert and oriented x3, cooperative and pleasant, no acute distress.  Head: normocephalic, atraumatic, neck supple.  Eyes: EOMI.  Musculoskeletal:  Right knee shows no effusion. Range of motion right knee is 5-125 with varus deformity. He does have varicose veins on that side. He is tender medially. There is no lateral tenderness or any instability noted. His gait pattern is antalgic on the right.  Calves soft and nontender. Motor function intact in LE. Strength 5/5 LE bilaterally. Neuro: Distal pulses 2+. Sensation to light touch intact in LE.   Imaging Review Plain radiographs demonstrate severe degenerative joint disease of the right knee. The overall alignment is  mild varus. The bone quality appears to be adequate for age and reported activity level.  Assessment/Plan:  End stage arthritis, right knee   The patient history, physical examination, clinical judgment of the provider and imaging studies are consistent with end stage degenerative joint disease of the right knee and total knee arthroplasty is deemed medically necessary. The treatment options including medical management, injection therapy arthroscopy and arthroplasty were discussed at length. The risks and benefits of total knee arthroplasty were presented and reviewed. The risks due to aseptic loosening, infection, stiffness, patella tracking problems, thromboembolic complications and other imponderables were discussed. The patient acknowledged the explanation, agreed to proceed with the plan and consent was signed. Patient is being admitted for inpatient treatment for surgery, pain control, PT, OT, prophylactic antibiotics, VTE prophylaxis, progressive ambulation and ADLs and discharge planning. The patient is planning to be discharged  home .   Patient's anticipated LOS is less than 2 midnights, meeting these requirements: - Lives within 1 hour of  care - Has a competent adult at home to recover with post-op recover - NO history of  - Chronic pain requiring opioids  - Diabetes  - Coronary Artery Disease  - Heart failure  - Heart attack  - Stroke  - DVT/VTE  - Cardiac arrhythmia  - Respiratory Failure/COPD  - Renal failure  - Anemia  - Advanced Liver disease  Therapy Plans: Outpatient therapy at [facility]) Disposition: Home with wife/daughter Planned DVT Prophylaxis: Aspirin 81 mg BID DME Needed: None PCP: Foye Deer, MD (clearance received) TXA: IV Allergies: NKDA Metal Allergy: None Anesthesia Concerns: N/V BMI: 32.9 Last HgbA1c: Not diabetic Pain Regimen: Oxycodone, tramadol Pharmacy: [facility]  Other: - Hx of tinnitus if on aspirin for extended period, will notify Tresa Endo if an issue postop and will switch to DOAC  - Patient was instructed on what medications to stop prior to surgery. - Follow-up visit in 2 weeks with Dr. Lequita Halt - Begin physical therapy following surgery - Pre-operative lab work as pre-surgical testing - Prescriptions will be provided in hospital at time of discharge  Arther Abbott, PA-C Orthopedic Surgery [facility] [place]

## 2023-06-02 DIAGNOSIS — M4726 Other spondylosis with radiculopathy, lumbar region: Secondary | ICD-10-CM | POA: Diagnosis not present

## 2023-06-08 NOTE — Patient Instructions (Signed)
 DUE TO COVID-19 ONLY TWO VISITORS  (aged 69 and older)  ARE ALLOWED TO COME WITH YOU AND STAY IN THE WAITING ROOM ONLY DURING PRE OP AND PROCEDURE.   **NO VISITORS ARE ALLOWED IN THE SHORT STAY AREA OR RECOVERY ROOM!!**  IF YOU WILL BE ADMITTED INTO THE HOSPITAL YOU ARE ALLOWED ONLY FOUR SUPPORT PEOPLE DURING VISITATION HOURS ONLY (7 AM -8PM)   The support person(s) must pass our screening, gel in and out, and wear a mask at all times, including in the patient's room. Patients must also wear a mask when staff or their support person are in the room. Visitors GUEST BADGE MUST BE WORN VISIBLY  One adult visitor may remain with you overnight and MUST be in the room by 8 P.M.     Your procedure is scheduled on: 06/26/23   Report to [facility] Main Entrance    Report to admitting at : 10:10 AM   Call this number if you have problems the morning of surgery [phone]   Do not eat food :After Midnight.   After Midnight you may have the following liquids until : 9:30 AM DAY OF SURGERY  Water Black Coffee (sugar ok, NO MILK/CREAM OR CREAMERS)  Tea (sugar ok, NO MILK/CREAM OR CREAMERS) regular and decaf                             Plain Jell-O (NO RED)                                           Fruit ices (not with fruit pulp, NO RED)                                     Popsicles (NO RED)                                                                  Juice: apple, WHITE grape, WHITE cranberry Sports drinks like Gatorade (NO RED)   The day of surgery:  Drink ONE (1) Pre-Surgery Clear Ensure at : 9:30 AM the morning of surgery. Drink in one sitting. Do not sip.  This drink was given to you during your hospital  pre-op appointment visit. Nothing else to drink after completing the  Pre-Surgery Clear Ensure or G2.          If you have questions, please contact your surgeon's office.  FOLLOW ANY ADDITIONAL PRE OP INSTRUCTIONS YOU RECEIVED FROM YOUR SURGEON'S OFFICE!!!   Oral  Hygiene is also important to reduce your risk of infection.                                    Remember - BRUSH YOUR TEETH THE MORNING OF SURGERY WITH YOUR REGULAR TOOTHPASTE  DENTURES WILL BE REMOVED PRIOR TO SURGERY PLEASE DO NOT APPLY "Poly grip" OR ADHESIVES!!!   Do NOT smoke after Midnight   Take these medicines the morning of surgery  with A SIP OF WATER: tamsulosin,famotidine.                              You may not have any metal on your body including hair pins, jewelry, and body piercing             Do not wear lotions, powders, perfumes/cologne, or deodorant              Men may shave face and neck.   Do not bring valuables to the hospital. [facility] IS NOT             RESPONSIBLE   FOR VALUABLES.   Contacts, glasses, or bridgework may not be worn into surgery.   Bring small overnight bag day of surgery.   DO NOT BRING YOUR HOME MEDICATIONS TO THE HOSPITAL. PHARMACY WILL DISPENSE MEDICATIONS LISTED ON YOUR MEDICATION LIST TO YOU DURING YOUR ADMISSION IN THE HOSPITAL!    Patients discharged on the day of surgery will not be allowed to drive home.  Someone NEEDS to stay with you for the first 24 hours after anesthesia.   Special Instructions: Bring a copy of your healthcare power of attorney and living will documents         the day of surgery if you haven't scanned them before.              Please read over the following fact sheets you were given: IF YOU HAVE QUESTIONS ABOUT YOUR PRE-OP INSTRUCTIONS PLEASE CALL [phone]      Pre-operative 5 CHG Bath Instructions   You can play a key role in reducing the risk of infection after surgery. Your skin needs to be as free of germs as possible. You can reduce the number of germs on your skin by washing with CHG (chlorhexidine gluconate) soap before surgery. CHG is an antiseptic soap that kills germs and continues to kill germs even after washing.   DO NOT use if you have an allergy to chlorhexidine/CHG or antibacterial  soaps. If your skin becomes reddened or irritated, stop using the CHG and notify one of our RNs at [phone].   Please shower with the CHG soap starting 4 days before surgery using the following schedule:     Please keep in mind the following:  DO NOT shave, including legs and underarms, starting the day of your first shower.   You may shave your face at any point before/day of surgery.  Place clean sheets on your bed the day you start using CHG soap. Use a clean washcloth (not used since being washed) for each shower. DO NOT sleep with pets once you start using the CHG.   CHG Shower Instructions:  If you choose to wash your hair and private area, wash first with your normal shampoo/soap.  After you use shampoo/soap, rinse your hair and body thoroughly to remove shampoo/soap residue.  Turn the water OFF and apply about 3 tablespoons (45 ml) of CHG soap to a CLEAN washcloth.  Apply CHG soap ONLY FROM YOUR NECK DOWN TO YOUR TOES (washing for 3-5 minutes)  DO NOT use CHG soap on face, private areas, open wounds, or sores.  Pay special attention to the area where your surgery is being performed.  If you are having back surgery, having someone wash your back for you may be helpful. Wait 2 minutes after CHG soap is applied, then you may rinse off the CHG soap.  Pat dry with a clean towel  Put on clean clothes/pajamas   If you choose to wear lotion, please use ONLY the CHG-compatible lotions on the back of this paper.     Additional instructions for the day of surgery: DO NOT APPLY any lotions, deodorants, cologne, or perfumes.   Put on clean/comfortable clothes.  Brush your teeth.  Ask your nurse before applying any prescription medications to the skin.   CHG Compatible Lotions   Aveeno Moisturizing lotion  Cetaphil Moisturizing Cream  Cetaphil Moisturizing Lotion  Clairol Herbal Essence Moisturizing Lotion, Dry Skin  Clairol Herbal Essence Moisturizing Lotion, Extra Dry Skin   Clairol Herbal Essence Moisturizing Lotion, Normal Skin  Curel Age Defying Therapeutic Moisturizing Lotion with Alpha Hydroxy  Curel Extreme Care Body Lotion  Curel Soothing Hands Moisturizing Hand Lotion  Curel Therapeutic Moisturizing Cream, Fragrance-Free  Curel Therapeutic Moisturizing Lotion, Fragrance-Free  Curel Therapeutic Moisturizing Lotion, Original Formula  Eucerin Daily Replenishing Lotion  Eucerin Dry Skin Therapy Plus Alpha Hydroxy Crme  Eucerin Dry Skin Therapy Plus Alpha Hydroxy Lotion  Eucerin Original Crme  Eucerin Original Lotion  Eucerin Plus Crme Eucerin Plus Lotion  Eucerin TriLipid Replenishing Lotion  Keri Anti-Bacterial Hand Lotion  Keri Deep Conditioning Original Lotion Dry Skin Formula Softly Scented  Keri Deep Conditioning Original Lotion, Fragrance Free Sensitive Skin Formula  Keri Lotion Fast Absorbing Fragrance Free Sensitive Skin Formula  Keri Lotion Fast Absorbing Softly Scented Dry Skin Formula  Keri Original Lotion  Keri Skin Renewal Lotion Keri Silky Smooth Lotion  Keri Silky Smooth Sensitive Skin Lotion  Nivea Body Creamy Conditioning Oil  Nivea Body Extra Enriched Lotion  Nivea Body Original Lotion  Nivea Body Sheer Moisturizing Lotion Nivea Crme  Nivea Skin Firming Lotion  NutraDerm 30 Skin Lotion  NutraDerm Skin Lotion  NutraDerm Therapeutic Skin Cream  NutraDerm Therapeutic Skin Lotion  ProShield Protective Hand Cream  Provon moisturizing lotion   Incentive Spirometer  An incentive spirometer is a tool that can help keep your lungs clear and active. This tool measures how well you are filling your lungs with each breath. Taking long deep breaths may help reverse or decrease the chance of developing breathing (pulmonary) problems (especially infection) following: A long period of time when you are unable to move or be active. BEFORE THE PROCEDURE  If the spirometer includes an indicator to show your best effort, your nurse or  respiratory therapist will set it to a desired goal. If possible, sit up straight or lean slightly forward. Try not to slouch. Hold the incentive spirometer in an upright position. INSTRUCTIONS FOR USE  Sit on the edge of your bed if possible, or sit up as far as you can in bed or on a chair. Hold the incentive spirometer in an upright position. Breathe out normally. Place the mouthpiece in your mouth and seal your lips tightly around it. Breathe in slowly and as deeply as possible, raising the piston or the ball toward the top of the column. Hold your breath for 3-5 seconds or for as long as possible. Allow the piston or ball to fall to the bottom of the column. Remove the mouthpiece from your mouth and breathe out normally. Rest for a few seconds and repeat Steps 1 through 7 at least 10 times every 1-2 hours when you are awake. Take your time and take a few normal breaths between deep breaths. The spirometer may include an indicator to show your best effort. Use the indicator as a goal to  work toward during each repetition. After each set of 10 deep breaths, practice coughing to be sure your lungs are clear. If you have an incision (the cut made at the time of surgery), support your incision when coughing by placing a pillow or rolled up towels firmly against it. Once you are able to get out of bed, walk around indoors and cough well. You may stop using the incentive spirometer when instructed by your caregiver.  RISKS AND COMPLICATIONS Take your time so you do not get dizzy or light-headed. If you are in pain, you may need to take or ask for pain medication before doing incentive spirometry. It is harder to take a deep breath if you are having pain. AFTER USE Rest and breathe slowly and easily. It can be helpful to keep track of a log of your progress. Your caregiver can provide you with a simple table to help with this. If you are using the spirometer at home, follow these  instructions: SEEK MEDICAL CARE IF:  You are having difficultly using the spirometer. You have trouble using the spirometer as often as instructed. Your pain medication is not giving enough relief while using the spirometer. You develop fever of 100.5 F (38.1 C) or higher. SEEK IMMEDIATE MEDICAL CARE IF:  You cough up bloody sputum that had not been present before. You develop fever of 102 F (38.9 C) or greater. You develop worsening pain at or near the incision site. MAKE SURE YOU:  Understand these instructions. Will watch your condition. Will get help right away if you are not doing well or get worse. Document Released: 06/20/2006 Document Revised: 05/02/2011 Document Reviewed: 08/21/2006 [facility] Patient Information 2014 [place], [place].   ________________________________________________________________________

## 2023-06-13 ENCOUNTER — Encounter (HOSPITAL_COMMUNITY): Payer: Self-pay

## 2023-06-13 ENCOUNTER — Other Ambulatory Visit: Payer: Self-pay

## 2023-06-13 ENCOUNTER — Encounter (HOSPITAL_COMMUNITY)
Admission: RE | Admit: 2023-06-13 | Discharge: 2023-06-13 | Disposition: A | Discharge status: Home / Self Care | Source: Ambulatory Visit | Attending: Orthopedic Surgery | Admitting: Orthopedic Surgery

## 2023-06-13 VITALS — BP 136/77 | HR 66 | Temp 98.2°F | Ht 71.0 in | Wt 226.0 lb

## 2023-06-13 DIAGNOSIS — Z01818 Encounter for other preprocedural examination: Secondary | ICD-10-CM | POA: Diagnosis not present

## 2023-06-13 HISTORY — DX: Pneumonia, unspecified organism: J18.9

## 2023-06-13 HISTORY — DX: Other specified postprocedural states: Z98.890

## 2023-06-13 HISTORY — DX: Other specified postprocedural states: R11.2

## 2023-06-13 HISTORY — DX: Other complications of anesthesia, initial encounter: T88.59XA

## 2023-06-13 LAB — CBC
HCT: 45 % (ref 39.0–52.0)
Hemoglobin: 14.8 g/dL (ref 13.0–17.0)
MCH: 29.8 pg (ref 26.0–34.0)
MCHC: 32.9 g/dL (ref 30.0–36.0)
MCV: 90.5 fL (ref 80.0–100.0)
Platelets: 232 10*3/uL (ref 150–400)
RBC: 4.97 MIL/uL (ref 4.22–5.81)
RDW: 12.6 % (ref 11.5–15.5)
WBC: 5.6 10*3/uL (ref 4.0–10.5)
nRBC: 0 % (ref 0.0–0.2)

## 2023-06-13 LAB — SURGICAL PCR SCREEN
MRSA, PCR: NEGATIVE
Staphylococcus aureus: NEGATIVE

## 2023-06-13 NOTE — Progress Notes (Signed)
 For Anesthesia: PCP - Adrian Hopper, MD  Cardiologist - Hassan Links, MD . LOV: 08/26/20  Bowel Prep reminder:  Chest x-ray -  EKG -  Stress Test -  ECHO -  Cardiac Cath -  Pacemaker/ICD device last checked: Pacemaker orders received: Device Rep notified:  Spinal Cord Stimulator:N/A  Sleep Study - N/A CPAP -   Fasting Blood Sugar - N/A Checks Blood Sugar _____ times a day Date and result of last Hgb A1c-  Last dose of GLP1 agonist- N/A GLP1 instructions:   Last dose of SGLT-2 inhibitors- N/A SGLT-2 instructions:   Blood Thinner Instructions:N/A Aspirin Instructions: Last Dose:  Activity level: Can go up a flight of stairs and activities of daily living without stopping and without chest pain and/or shortness of breath   Able to exercise without chest pain and/or shortness of breath  Anesthesia review:   Patient denies shortness of breath, fever, cough and chest pain at PAT appointment   Patient verbalized understanding of instructions that were given to them at the PAT appointment. Patient was also instructed that they will need to review over the PAT instructions again at home before surgery.

## 2023-06-20 DIAGNOSIS — R7301 Impaired fasting glucose: Secondary | ICD-10-CM | POA: Diagnosis not present

## 2023-06-20 DIAGNOSIS — Z125 Encounter for screening for malignant neoplasm of prostate: Secondary | ICD-10-CM | POA: Diagnosis not present

## 2023-06-20 DIAGNOSIS — K219 Gastro-esophageal reflux disease without esophagitis: Secondary | ICD-10-CM | POA: Diagnosis not present

## 2023-06-20 DIAGNOSIS — K449 Diaphragmatic hernia without obstruction or gangrene: Secondary | ICD-10-CM | POA: Diagnosis not present

## 2023-06-20 DIAGNOSIS — E785 Hyperlipidemia, unspecified: Secondary | ICD-10-CM | POA: Diagnosis not present

## 2023-06-20 DIAGNOSIS — L719 Rosacea, unspecified: Secondary | ICD-10-CM | POA: Diagnosis not present

## 2023-06-20 DIAGNOSIS — I700 Atherosclerosis of aorta: Secondary | ICD-10-CM | POA: Diagnosis not present

## 2023-06-20 DIAGNOSIS — Z6831 Body mass index (BMI) 31.0-31.9, adult: Secondary | ICD-10-CM | POA: Diagnosis not present

## 2023-06-25 NOTE — Anesthesia Preprocedure Evaluation (Addendum)
 Anesthesia Evaluation  Patient identified by MRN, date of birth, ID band Patient awake    Reviewed: Allergy & Precautions, NPO status , Patient's Chart, lab work & pertinent test results  History of Anesthesia Complications (+) PONV and history of anesthetic complications  Airway Mallampati: II  TM Distance: >3 FB Neck ROM: Full    Dental no notable dental hx. (+) Teeth Intact, Dental Advisory Given   Pulmonary neg pulmonary ROS   Pulmonary exam normal breath sounds clear to auscultation       Cardiovascular Exercise Tolerance: Good (-) hypertension(-) angina (-) Past MI Normal cardiovascular exam Rhythm:Regular Rate:Normal     Neuro/Psych negative neurological ROS  negative psych ROS   GI/Hepatic ,GERD  Medicated and Controlled,,  Endo/Other  negative endocrine ROS    Renal/GU      Musculoskeletal  (+) Arthritis , Osteoarthritis,    Abdominal   Peds  Hematology   Anesthesia Other Findings All: sulfa  Reproductive/Obstetrics                             Anesthesia Physical Anesthesia Plan  ASA: 3  Anesthesia Plan: Spinal and Regional   Post-op Pain Management: Regional block*   Induction:   PONV Risk Score and Plan: 2 and Propofol infusion, Treatment may vary due to age or medical condition, Ondansetron and Midazolam   Airway Management Planned: Natural Airway and Nasal Cannula  Additional Equipment: None  Intra-op Plan:   Post-operative Plan:   Informed Consent: I have reviewed the patients History and Physical, chart, labs and discussed the procedure including the risks, benefits and alternatives for the proposed anesthesia with the patient or authorized representative who has indicated his/her understanding and acceptance.     Dental advisory given  Plan Discussed with: CRNA and Surgeon  Anesthesia Plan Comments: (Spinal w R adductor)       Anesthesia Quick  Evaluation

## 2023-06-26 ENCOUNTER — Encounter (HOSPITAL_COMMUNITY)
Admission: RE | Disposition: A | Discharge status: Home / Self Care | Payer: Self-pay | Source: Home / Self Care | Attending: Orthopedic Surgery

## 2023-06-26 ENCOUNTER — Ambulatory Visit (HOSPITAL_BASED_OUTPATIENT_CLINIC_OR_DEPARTMENT_OTHER): Payer: Self-pay | Admitting: Anesthesiology

## 2023-06-26 ENCOUNTER — Other Ambulatory Visit: Payer: Self-pay

## 2023-06-26 ENCOUNTER — Observation Stay (HOSPITAL_COMMUNITY)
Admission: RE | Admit: 2023-06-26 | Discharge: 2023-06-27 | Disposition: A | Discharge status: Home / Self Care | Payer: PPO | Attending: Orthopedic Surgery | Admitting: Orthopedic Surgery

## 2023-06-26 ENCOUNTER — Encounter (HOSPITAL_COMMUNITY): Payer: Self-pay | Admitting: Orthopedic Surgery

## 2023-06-26 ENCOUNTER — Ambulatory Visit (HOSPITAL_COMMUNITY): Payer: Self-pay | Admitting: Anesthesiology

## 2023-06-26 DIAGNOSIS — Z7982 Long term (current) use of aspirin: Secondary | ICD-10-CM | POA: Diagnosis not present

## 2023-06-26 DIAGNOSIS — G8918 Other acute postprocedural pain: Secondary | ICD-10-CM | POA: Diagnosis not present

## 2023-06-26 DIAGNOSIS — M179 Osteoarthritis of knee, unspecified: Principal | ICD-10-CM

## 2023-06-26 DIAGNOSIS — M1711 Unilateral primary osteoarthritis, right knee: Secondary | ICD-10-CM | POA: Diagnosis not present

## 2023-06-26 DIAGNOSIS — E7800 Pure hypercholesterolemia, unspecified: Secondary | ICD-10-CM

## 2023-06-26 DIAGNOSIS — E785 Hyperlipidemia, unspecified: Secondary | ICD-10-CM | POA: Diagnosis not present

## 2023-06-26 HISTORY — PX: TOTAL KNEE ARTHROPLASTY: SHX125

## 2023-06-26 SURGERY — ARTHROPLASTY, KNEE, TOTAL
Anesthesia: Regional | Site: Knee | Laterality: Right

## 2023-06-26 MED ORDER — OXYCODONE HCL 5 MG/5ML PO SOLN: 5.0000 mg | Freq: Once | ORAL | Status: DC | PRN

## 2023-06-26 MED ORDER — SODIUM CHLORIDE (PF) 0.9 % IJ SOLN
INTRAMUSCULAR | Status: AC
  Filled 2023-06-26: qty 10

## 2023-06-26 MED ORDER — OXYCODONE HCL 5 MG PO TABS: 5.0000 mg | ORAL_TABLET | Freq: Once | ORAL | Status: DC | PRN

## 2023-06-26 MED ORDER — ONDANSETRON HCL 4 MG/2ML IJ SOLN
INTRAMUSCULAR | Status: DC | PRN
  Administered 2023-06-26: 4 mg via INTRAVENOUS

## 2023-06-26 MED ORDER — STERILE WATER FOR IRRIGATION IR SOLN
Status: DC | PRN
Start: 2023-06-26 — End: 2023-06-26
  Administered 2023-06-26: 1000 mL

## 2023-06-26 MED ORDER — PROPOFOL 500 MG/50ML IV EMUL
INTRAVENOUS | Status: DC | PRN
  Administered 2023-06-26: 100 ug/kg/min via INTRAVENOUS

## 2023-06-26 MED ORDER — LORATADINE 10 MG PO TABS
10.0000 mg | ORAL_TABLET | Freq: Every day | ORAL | Status: DC
  Administered 2023-06-26 – 2023-06-27 (×2): 10 mg via ORAL
  Filled 2023-06-26 (×2): qty 1

## 2023-06-26 MED ORDER — ONDANSETRON HCL 4 MG/2ML IJ SOLN
INTRAMUSCULAR | Status: AC
  Filled 2023-06-26: qty 2

## 2023-06-26 MED ORDER — BUPIVACAINE LIPOSOME 1.3 % IJ SUSP: 20.0000 mL | Freq: Once | INTRAMUSCULAR | Status: DC

## 2023-06-26 MED ORDER — DEXAMETHASONE SODIUM PHOSPHATE 10 MG/ML IJ SOLN
INTRAMUSCULAR | Status: AC
  Filled 2023-06-26: qty 1

## 2023-06-26 MED ORDER — EPHEDRINE 5 MG/ML INJ
INTRAVENOUS | Status: AC
  Filled 2023-06-26: qty 5

## 2023-06-26 MED ORDER — MENTHOL 3 MG MT LOZG: 1.0000 | LOZENGE | OROMUCOSAL | Status: DC | PRN

## 2023-06-26 MED ORDER — ACETAMINOPHEN 500 MG PO TABS
1000.0000 mg | ORAL_TABLET | Freq: Four times a day (QID) | ORAL | Status: DC
  Administered 2023-06-26 – 2023-06-27 (×3): 1000 mg via ORAL
  Filled 2023-06-26 (×3): qty 2

## 2023-06-26 MED ORDER — BISACODYL 10 MG RE SUPP: 10.0000 mg | Freq: Every day | RECTAL | Status: DC | PRN

## 2023-06-26 MED ORDER — DOCUSATE SODIUM 100 MG PO CAPS
100.0000 mg | ORAL_CAPSULE | Freq: Two times a day (BID) | ORAL | Status: DC
  Administered 2023-06-26 – 2023-06-27 (×2): 100 mg via ORAL
  Filled 2023-06-26 (×2): qty 1

## 2023-06-26 MED ORDER — BUPIVACAINE IN DEXTROSE 0.75-8.25 % IT SOLN
INTRATHECAL | Status: DC | PRN
  Administered 2023-06-26: 1.8 mL via INTRATHECAL

## 2023-06-26 MED ORDER — OXYCODONE HCL 5 MG PO TABS
10.0000 mg | ORAL_TABLET | ORAL | Status: DC | PRN
  Administered 2023-06-26: 10 mg via ORAL
  Administered 2023-06-26 – 2023-06-27 (×3): 15 mg via ORAL
  Filled 2023-06-26 (×3): qty 3

## 2023-06-26 MED ORDER — GABAPENTIN 300 MG PO CAPS
300.0000 mg | ORAL_CAPSULE | Freq: Three times a day (TID) | ORAL | Status: DC
Start: 2023-06-26 — End: 2023-06-27
  Administered 2023-06-26 – 2023-06-27 (×3): 300 mg via ORAL
  Filled 2023-06-26 (×3): qty 1

## 2023-06-26 MED ORDER — BUPIVACAINE LIPOSOME 1.3 % IJ SUSP
INTRAMUSCULAR | Status: AC
  Filled 2023-06-26: qty 20

## 2023-06-26 MED ORDER — DEXAMETHASONE SODIUM PHOSPHATE 10 MG/ML IJ SOLN
8.0000 mg | Freq: Once | INTRAMUSCULAR | Status: AC
  Administered 2023-06-26: 8 mg via INTRAVENOUS

## 2023-06-26 MED ORDER — SODIUM CHLORIDE (PF) 0.9 % IJ SOLN
INTRAMUSCULAR | Status: AC
  Filled 2023-06-26: qty 50

## 2023-06-26 MED ORDER — TRAMADOL HCL 50 MG PO TABS: 50.0000 mg | ORAL_TABLET | Freq: Four times a day (QID) | ORAL | Status: DC | PRN

## 2023-06-26 MED ORDER — FENOFIBRATE 160 MG PO TABS
160.0000 mg | ORAL_TABLET | Freq: Every day | ORAL | Status: DC
  Administered 2023-06-27: 160 mg via ORAL
  Filled 2023-06-26: qty 1

## 2023-06-26 MED ORDER — LACTATED RINGERS IV SOLN: INTRAVENOUS | Status: DC

## 2023-06-26 MED ORDER — ASPIRIN 81 MG PO CHEW
81.0000 mg | CHEWABLE_TABLET | Freq: Two times a day (BID) | ORAL | Status: DC
  Administered 2023-06-27: 81 mg via ORAL
  Filled 2023-06-26: qty 1

## 2023-06-26 MED ORDER — FLEET ENEMA RE ENEM: 1.0000 | ENEMA | Freq: Once | RECTAL | Status: DC | PRN

## 2023-06-26 MED ORDER — SODIUM CHLORIDE (PF) 0.9 % IJ SOLN
INTRAMUSCULAR | Status: DC | PRN
  Administered 2023-06-26: 80 mL

## 2023-06-26 MED ORDER — METHOCARBAMOL 1000 MG/10ML IJ SOLN: 500.0000 mg | Freq: Four times a day (QID) | INTRAMUSCULAR | Status: DC | PRN

## 2023-06-26 MED ORDER — POVIDONE-IODINE 10 % EX SWAB: 2.0000 | Freq: Once | CUTANEOUS | Status: DC

## 2023-06-26 MED ORDER — ONDANSETRON HCL 4 MG/2ML IJ SOLN: 4.0000 mg | Freq: Four times a day (QID) | INTRAMUSCULAR | Status: DC | PRN

## 2023-06-26 MED ORDER — CARBOXYMETH-GLYCERIN-POLYSORB 0.5-1-0.5 % OP SOLN
1.0000 [drp] | Freq: Three times a day (TID) | OPHTHALMIC | Status: DC | PRN
Start: 2023-06-26 — End: 2023-06-26

## 2023-06-26 MED ORDER — MORPHINE SULFATE (PF) 2 MG/ML IV SOLN
1.0000 mg | INTRAVENOUS | Status: DC | PRN
  Administered 2023-06-27: 2 mg via INTRAVENOUS
  Filled 2023-06-26: qty 1

## 2023-06-26 MED ORDER — ORAL CARE MOUTH RINSE: 15.0000 mL | Freq: Once | OROMUCOSAL | Status: AC

## 2023-06-26 MED ORDER — SODIUM CHLORIDE 0.9 % IV SOLN: INTRAVENOUS | Status: DC

## 2023-06-26 MED ORDER — POLYETHYLENE GLYCOL 3350 17 G PO PACK: 17.0000 g | PACK | Freq: Every day | ORAL | Status: DC | PRN

## 2023-06-26 MED ORDER — CHLORHEXIDINE GLUCONATE 0.12 % MT SOLN
15.0000 mL | Freq: Once | OROMUCOSAL | Status: AC
  Administered 2023-06-26: 15 mL via OROMUCOSAL

## 2023-06-26 MED ORDER — ONDANSETRON HCL 4 MG/2ML IJ SOLN: 4.0000 mg | Freq: Once | INTRAMUSCULAR | Status: DC | PRN

## 2023-06-26 MED ORDER — FAMOTIDINE 20 MG PO TABS
40.0000 mg | ORAL_TABLET | Freq: Every day | ORAL | Status: DC
  Administered 2023-06-27: 40 mg via ORAL
  Filled 2023-06-26: qty 2

## 2023-06-26 MED ORDER — OXYCODONE HCL 5 MG PO TABS
5.0000 mg | ORAL_TABLET | ORAL | Status: DC | PRN
  Administered 2023-06-26: 10 mg via ORAL
  Filled 2023-06-26 (×2): qty 2

## 2023-06-26 MED ORDER — CEFAZOLIN SODIUM-DEXTROSE 2-4 GM/100ML-% IV SOLN
2.0000 g | Freq: Four times a day (QID) | INTRAVENOUS | Status: AC
  Administered 2023-06-26 (×2): 2 g via INTRAVENOUS
  Filled 2023-06-26 (×2): qty 100

## 2023-06-26 MED ORDER — MIDAZOLAM HCL 2 MG/2ML IJ SOLN: 1.0000 mg | INTRAMUSCULAR | Status: DC

## 2023-06-26 MED ORDER — TRANEXAMIC ACID-NACL 1000-0.7 MG/100ML-% IV SOLN
1000.0000 mg | INTRAVENOUS | Status: AC
  Administered 2023-06-26: 1000 mg via INTRAVENOUS
  Filled 2023-06-26: qty 100

## 2023-06-26 MED ORDER — FENTANYL CITRATE PF 50 MCG/ML IJ SOSY
50.0000 ug | PREFILLED_SYRINGE | INTRAMUSCULAR | Status: AC
  Administered 2023-06-26: 50 ug via INTRAVENOUS
  Filled 2023-06-26: qty 2

## 2023-06-26 MED ORDER — POLYVINYL ALCOHOL 1.4 % OP SOLN: 1.0000 [drp] | Freq: Three times a day (TID) | OPHTHALMIC | Status: DC | PRN

## 2023-06-26 MED ORDER — CLONIDINE HCL (ANALGESIA) 100 MCG/ML EP SOLN
EPIDURAL | Status: DC | PRN
  Administered 2023-06-26: 100 ug

## 2023-06-26 MED ORDER — ACETAMINOPHEN 325 MG PO TABS: 325.0000 mg | ORAL_TABLET | Freq: Four times a day (QID) | ORAL | Status: DC | PRN

## 2023-06-26 MED ORDER — HYDROMORPHONE HCL 1 MG/ML IJ SOLN: 0.2500 mg | INTRAMUSCULAR | Status: DC | PRN

## 2023-06-26 MED ORDER — ONDANSETRON HCL 4 MG PO TABS: 4.0000 mg | ORAL_TABLET | Freq: Four times a day (QID) | ORAL | Status: DC | PRN

## 2023-06-26 MED ORDER — DEXAMETHASONE SODIUM PHOSPHATE 10 MG/ML IJ SOLN
10.0000 mg | Freq: Once | INTRAMUSCULAR | Status: AC
  Administered 2023-06-27: 10 mg via INTRAVENOUS
  Filled 2023-06-26: qty 1

## 2023-06-26 MED ORDER — ROPIVACAINE HCL 5 MG/ML IJ SOLN
INTRAMUSCULAR | Status: DC | PRN
  Administered 2023-06-26: 30 mL via PERINEURAL

## 2023-06-26 MED ORDER — METHOCARBAMOL 500 MG PO TABS
500.0000 mg | ORAL_TABLET | Freq: Four times a day (QID) | ORAL | Status: DC | PRN
  Administered 2023-06-26 – 2023-06-27 (×3): 500 mg via ORAL
  Filled 2023-06-26 (×3): qty 1

## 2023-06-26 MED ORDER — DIPHENHYDRAMINE HCL 12.5 MG/5ML PO ELIX: 12.5000 mg | ORAL_SOLUTION | ORAL | Status: DC | PRN

## 2023-06-26 MED ORDER — TAMSULOSIN HCL 0.4 MG PO CAPS
0.4000 mg | ORAL_CAPSULE | Freq: Every day | ORAL | Status: DC
  Administered 2023-06-26 – 2023-06-27 (×2): 0.4 mg via ORAL
  Filled 2023-06-26 (×2): qty 1

## 2023-06-26 MED ORDER — PROPOFOL 1000 MG/100ML IV EMUL
INTRAVENOUS | Status: AC
  Filled 2023-06-26: qty 100

## 2023-06-26 MED ORDER — METOCLOPRAMIDE HCL 5 MG PO TABS: 5.0000 mg | ORAL_TABLET | Freq: Three times a day (TID) | ORAL | Status: DC | PRN

## 2023-06-26 MED ORDER — METOCLOPRAMIDE HCL 5 MG/ML IJ SOLN: 5.0000 mg | Freq: Three times a day (TID) | INTRAMUSCULAR | Status: DC | PRN

## 2023-06-26 MED ORDER — PROPOFOL 10 MG/ML IV BOLUS
INTRAVENOUS | Status: DC | PRN
  Administered 2023-06-26: 30 mg via INTRAVENOUS
  Administered 2023-06-26: 20 mg via INTRAVENOUS

## 2023-06-26 MED ORDER — PHENOL 1.4 % MT LIQD: 1.0000 | OROMUCOSAL | Status: DC | PRN

## 2023-06-26 MED ORDER — CEFAZOLIN SODIUM-DEXTROSE 2-4 GM/100ML-% IV SOLN
2.0000 g | INTRAVENOUS | Status: AC
  Administered 2023-06-26: 2 g via INTRAVENOUS
  Filled 2023-06-26: qty 100

## 2023-06-26 MED ORDER — 0.9 % SODIUM CHLORIDE (POUR BTL) OPTIME
TOPICAL | Status: DC | PRN
  Administered 2023-06-26: 1000 mL

## 2023-06-26 MED ORDER — SODIUM CHLORIDE 0.9 % IR SOLN
Status: DC | PRN
  Administered 2023-06-26: 1000 mL

## 2023-06-26 MED ORDER — ACETAMINOPHEN 10 MG/ML IV SOLN: 1000.0000 mg | Freq: Once | INTRAVENOUS | Status: DC | PRN

## 2023-06-26 MED ORDER — ACETAMINOPHEN 10 MG/ML IV SOLN
1000.0000 mg | Freq: Once | INTRAVENOUS | Status: AC
  Administered 2023-06-26: 1000 mg via INTRAVENOUS
  Filled 2023-06-26: qty 100

## 2023-06-26 SURGICAL SUPPLY — 44 items
ATTUNE MED DOME PAT 41 KNEE (Knees) IMPLANT
ATTUNE PS FEM RT SZ 7 CEM KNEE (Femur) IMPLANT
ATTUNE PSRP INSR SZ7 8 KNEE (Insert) IMPLANT
BAG COUNTER SPONGE SURGICOUNT (BAG) IMPLANT
BAG ZIPLOCK 12X15 (MISCELLANEOUS) ×1 IMPLANT
BASE TIBIAL ROT PLAT SZ 7 KNEE (Knees) IMPLANT
BLADE SAG 18X100X1.27 (BLADE) ×1 IMPLANT
BLADE SAW SGTL 11.0X1.19X90.0M (BLADE) ×1 IMPLANT
BNDG ELASTIC 6INX 5YD STR LF (GAUZE/BANDAGES/DRESSINGS) ×1 IMPLANT
BOWL SMART MIX CTS (DISPOSABLE) ×1 IMPLANT
CEMENT HV SMART SET (Cement) ×2 IMPLANT
COVER SURGICAL LIGHT HANDLE (MISCELLANEOUS) ×1 IMPLANT
CUFF TRNQT CYL 34X4.125X (TOURNIQUET CUFF) ×1 IMPLANT
DERMABOND ADVANCED .7 DNX12 (GAUZE/BANDAGES/DRESSINGS) ×1 IMPLANT
DRAPE U-SHAPE 47X51 STRL (DRAPES) ×1 IMPLANT
DRSG AQUACEL AG ADV 3.5X10 (GAUZE/BANDAGES/DRESSINGS) ×1 IMPLANT
DURAPREP 26ML APPLICATOR (WOUND CARE) ×1 IMPLANT
ELECT PENCIL ROCKER SW 15FT (MISCELLANEOUS) ×1 IMPLANT
ELECT REM PT RETURN 15FT ADLT (MISCELLANEOUS) ×1 IMPLANT
GLOVE BIO SURGEON STRL SZ 6.5 (GLOVE) IMPLANT
GLOVE BIO SURGEON STRL SZ7 (GLOVE) IMPLANT
GLOVE BIO SURGEON STRL SZ8 (GLOVE) ×1 IMPLANT
GLOVE BIOGEL PI IND STRL 7.0 (GLOVE) ×1 IMPLANT
GLOVE BIOGEL PI IND STRL 8 (GLOVE) ×1 IMPLANT
GOWN STRL REUS W/ TWL LRG LVL3 (GOWN DISPOSABLE) ×1 IMPLANT
HOLDER FOLEY CATH W/STRAP (MISCELLANEOUS) ×1 IMPLANT
IMMOBILIZER KNEE 20 (SOFTGOODS) ×1 IMPLANT
IMMOBILIZER KNEE 20 THIGH 36 (SOFTGOODS) ×1 IMPLANT
KIT TURNOVER KIT A (KITS) ×1 IMPLANT
MANIFOLD NEPTUNE II (INSTRUMENTS) ×1 IMPLANT
NS IRRIG 1000ML POUR BTL (IV SOLUTION) ×1 IMPLANT
PACK TOTAL KNEE CUSTOM (KITS) ×1 IMPLANT
PADDING CAST COTTON 6X4 STRL (CAST SUPPLIES) ×2 IMPLANT
PIN STEINMAN FIXATION KNEE (PIN) IMPLANT
PROTECTOR NERVE ULNAR (MISCELLANEOUS) ×1 IMPLANT
SET HNDPC FAN SPRY TIP SCT (DISPOSABLE) ×1 IMPLANT
SUT MNCRL AB 4-0 PS2 18 (SUTURE) ×1 IMPLANT
SUT VIC AB 2-0 CT1 TAPERPNT 27 (SUTURE) ×3 IMPLANT
SUTURE STRATFX 0 PDS 27 VIOLET (SUTURE) ×1 IMPLANT
TOWEL GREEN STERILE FF (TOWEL DISPOSABLE) ×1 IMPLANT
TRAY FOLEY MTR SLVR 16FR STAT (SET/KITS/TRAYS/PACK) ×1 IMPLANT
TUBE SUCTION HIGH CAP CLEAR NV (SUCTIONS) ×1 IMPLANT
WATER STERILE IRR 1000ML POUR (IV SOLUTION) ×2 IMPLANT
WRAP KNEE MAXI GEL POST OP (GAUZE/BANDAGES/DRESSINGS) ×1 IMPLANT

## 2023-06-26 NOTE — Discharge Instructions (Signed)
 Marc Gross, MD Total Joint Specialist [facility] [address], [address] [place], [place] [address] [phone]  TOTAL KNEE REPLACEMENT POSTOPERATIVE DIRECTIONS    Knee Rehabilitation, Guidelines Following Surgery  Results after knee surgery are often greatly improved when you follow the exercise, range of motion and muscle strengthening exercises prescribed by your doctor. Safety measures are also important to protect the knee from further injury. If any of these exercises cause you to have increased pain or swelling in your knee joint, decrease the amount until you are comfortable again and slowly increase them. If you have problems or questions, call your caregiver or physical therapist for advice.   BLOOD CLOT PREVENTION Take 81 mg Aspirin two times a day for three weeks following surgery. Then take an 81 mg Aspirin once a day for three weeks. Then discontinue Aspirin. You may resume your vitamins/supplements upon discharge from the hospital. Do not take any NSAIDs (Advil, Aleve, Ibuprofen, Meloxicam, etc.) for 3 weeks, while taking 81mg  Aspirin twice a day.   HOME CARE INSTRUCTIONS  Remove items at home which could result in a fall. This includes throw rugs or furniture in walking pathways.  ICE to the affected knee as much as tolerated. Icing helps control swelling. If the swelling is well controlled you will be more comfortable and rehab easier. Continue to use ice on the knee for pain and swelling from surgery. You may notice swelling that will progress down to the foot and ankle. This is normal after surgery. Elevate the leg when you are not up walking on it.    Continue to use the breathing machine which will help keep your temperature down. It is common for your temperature to cycle up and down following surgery, especially at night when you are not up moving around and exerting yourself. The breathing machine keeps your lungs expanded and your temperature  down. Do not place pillow under the operative knee, focus on keeping the knee straight while resting  DIET You may resume your previous home diet once you are discharged from the hospital.  DRESSING / WOUND CARE / SHOWERING Keep your bulky bandage on for 2 days. On the third post-operative day you may remove the Ace bandage and gauze. There is a waterproof adhesive bandage on your skin which will stay in place until your first follow-up appointment. Once you remove this you will not need to place another bandage You may begin showering 3 days following surgery, but do not submerge the incision under water.  ACTIVITY For the first 5 days, the key is rest and control of pain and swelling Do your home exercises twice a day starting on post-operative day 3. On the days you go to physical therapy, just do the home exercises once that day. You should rest, ice and elevate the leg for 50 minutes out of every hour. Get up and walk/stretch for 10 minutes per hour. After 5 days you can increase your activity slowly as tolerated. Walk with your walker as instructed. Use the walker until you are comfortable transitioning to a cane. Walk with the cane in the opposite hand of the operative leg. You may discontinue the cane once you are comfortable and walking steadily. Avoid periods of inactivity such as sitting longer than an hour when not asleep. This helps prevent blood clots.  You may discontinue the knee immobilizer once you are able to perform a straight leg raise while lying down. You may resume a sexual relationship in  one month or when given the OK by your doctor.  You may return to work once you are cleared by your doctor.  Do not drive a car for 6 weeks or until released by your surgeon.  Do not drive while taking narcotics.  TED HOSE STOCKINGS Wear the elastic stockings on both legs for three weeks following surgery during the day. You may remove them at night for sleeping.  WEIGHT  BEARING Weight bearing as tolerated with assist device (walker, cane, etc) as directed, use it as long as suggested by your surgeon or therapist, typically at least 4-6 weeks.  POSTOPERATIVE CONSTIPATION PROTOCOL Constipation - defined medically as fewer than three stools per week and severe constipation as less than one stool per week.  One of the most common issues patients have following surgery is constipation.  Even if you have a regular bowel pattern at home, your normal regimen is likely to be disrupted due to multiple reasons following surgery.  Combination of anesthesia, postoperative narcotics, change in appetite and fluid intake all can affect your bowels.  In order to avoid complications following surgery, here are some recommendations in order to help you during your recovery period.  Colace (docusate) - Pick up an over-the-counter form of Colace or another stool softener and take twice a day as long as you are requiring postoperative pain medications.  Take with a full glass of water daily.  If you experience loose stools or diarrhea, hold the colace until you stool forms back up. If your symptoms do not get better within 1 week or if they get worse, check with your doctor. Dulcolax (bisacodyl) - Pick up over-the-counter and take as directed by the product packaging as needed to assist with the movement of your bowels.  Take with a full glass of water.  Use this product as needed if not relieved by Colace only.  MiraLax (polyethylene glycol) - Pick up over-the-counter to have on hand. MiraLax is a solution that will increase the amount of water in your bowels to assist with bowel movements.  Take as directed and can mix with a glass of water, juice, soda, coffee, or tea. Take if you go more than two days without a movement. Do not use MiraLax more than once per day. Call your doctor if you are still constipated or irregular after using this medication for 7 days in a row.  If you continue  to have problems with postoperative constipation, please contact the office for further assistance and recommendations.  If you experience "the worst abdominal pain ever" or develop nausea or vomiting, please contact the office immediatly for further recommendations for treatment.  ITCHING If you experience itching with your medications, try taking only a single pain pill, or even half a pain pill at a time.  You can also use Benadryl over the counter for itching or also to help with sleep.   MEDICATIONS See your medication summary on the "After Visit Summary" that the nursing staff will review with you prior to discharge.  You may have some home medications which will be placed on hold until you complete the course of blood thinner medication.  It is important for you to complete the blood thinner medication as prescribed by your surgeon.  Continue your approved medications as instructed at time of discharge.  PRECAUTIONS If you experience chest pain or shortness of breath - call 911 immediately for transfer to the hospital emergency department.  If you develop a fever greater that  101 F, purulent drainage from wound, increased redness or drainage from wound, foul odor from the wound/dressing, or calf pain - CONTACT YOUR SURGEON.                                                   FOLLOW-UP APPOINTMENTS Make sure you keep all of your appointments after your operation with your surgeon and caregivers. You should call the office at the above phone number and make an appointment for approximately two weeks after the date of your surgery or on the date instructed by your surgeon outlined in the "After Visit Summary".  RANGE OF MOTION AND STRENGTHENING EXERCISES  Rehabilitation of the knee is important following a knee injury or an operation. After just a few days of immobilization, the muscles of the thigh which control the knee become weakened and shrink (atrophy). Knee exercises are designed to build up  the tone and strength of the thigh muscles and to improve knee motion. Often times heat used for twenty to thirty minutes before working out will loosen up your tissues and help with improving the range of motion but do not use heat for the first two weeks following surgery. These exercises can be done on a training (exercise) mat, on the floor, on a table or on a bed. Use what ever works the best and is most comfortable for you Knee exercises include:  Leg Lifts - While your knee is still immobilized in a splint or cast, you can do straight leg raises. Lift the leg to 60 degrees, hold for 3 sec, and slowly lower the leg. Repeat 10-20 times 2-3 times daily. Perform this exercise against resistance later as your knee gets better.  Quad and Hamstring Sets - Tighten up the muscle on the front of the thigh (Quad) and hold for 5-10 sec. Repeat this 10-20 times hourly. Hamstring sets are done by pushing the foot backward against an object and holding for 5-10 sec. Repeat as with quad sets.  Leg Slides: Lying on your back, slowly slide your foot toward your buttocks, bending your knee up off the floor (only go as far as is comfortable). Then slowly slide your foot back down until your leg is flat on the floor again. Angel Wings: Lying on your back spread your legs to the side as far apart as you can without causing discomfort.  A rehabilitation program following serious knee injuries can speed recovery and prevent re-injury in the future due to weakened muscles. Contact your doctor or a physical therapist for more information on knee rehabilitation.   POST-OPERATIVE OPIOID TAPER INSTRUCTIONS: It is important to wean off of your opioid medication as soon as possible. If you do not need pain medication after your surgery it is ok to stop day one. Opioids include: Codeine, Hydrocodone(Norco, Vicodin), Oxycodone(Percocet, oxycontin) and hydromorphone amongst others.  Long term and even short term use of opiods can  cause: Increased pain response Dependence Constipation Depression Respiratory depression And more.  Withdrawal symptoms can include Flu like symptoms Nausea, vomiting And more Techniques to manage these symptoms Hydrate well Eat regular healthy meals Stay active Use relaxation techniques(deep breathing, meditating, yoga) Do Not substitute Alcohol to help with tapering If you have been on opioids for less than two weeks and do not have pain than it is ok to stop all together.  Plan to wean off of opioids This plan should start within one week post op of your joint replacement. Maintain the same interval or time between taking each dose and first decrease the dose.  Cut the total daily intake of opioids by one tablet each day Next start to increase the time between doses. The last dose that should be eliminated is the evening dose.   IF YOU ARE TRANSFERRED TO A SKILLED REHAB FACILITY If the patient is transferred to a skilled rehab facility following release from the hospital, a list of the current medications will be sent to the facility for the patient to continue.  When discharged from the skilled rehab facility, please have the facility set up the patient's Home Health Physical Therapy prior to being released. Also, the skilled facility will be responsible for providing the patient with their medications at time of release from the facility to include their pain medication, the muscle relaxants, and their blood thinner medication. If the patient is still at the rehab facility at time of the two week follow up appointment, the skilled rehab facility will also need to assist the patient in arranging follow up appointment in our office and any transportation needs.  MAKE SURE YOU:  Understand these instructions.  Get help right away if you are not doing well or get worse.   DENTAL ANTIBIOTICS:  In most cases prophylactic antibiotics for Dental procdeures after total joint surgery are  not necessary.  Exceptions are as follows:  1. History of prior total joint infection  2. Severely immunocompromised (Organ Transplant, cancer chemotherapy, Rheumatoid biologic meds such as Humera)  3. Poorly controlled diabetes (A1C &gt; 8.0, blood glucose over 200)  If you have one of these conditions, contact your surgeon for an antibiotic prescription, prior to your dental procedure.    Pick up stool softner and laxative for home use following surgery while on pain medications. Do not submerge incision under water. Please use good hand washing techniques while changing dressing each day. May shower starting three days after surgery. Please use a clean towel to pat the incision dry following showers. Continue to use ice for pain and swelling after surgery. Do not use any lotions or creams on the incision until instructed by your surgeon.

## 2023-06-26 NOTE — Progress Notes (Signed)
 Orthopedic Tech Progress Note Patient Details:  Marc Faulkner 23-Nov-1955 [id number]  CPM Right Knee CPM Right Knee: On Right Knee Flexion (Degrees): 40 Right Knee Extension (Degrees): 10  Post Interventions Patient Tolerated: Well  Toi Foster 06/26/2023, 12:06 PM

## 2023-06-26 NOTE — Interval H&P Note (Signed)
 History and Physical Interval Note:  06/26/2023 8:25 AM  Marc Faulkner  has presented today for surgery, with the diagnosis of Right Knee osteoarthritits.  The various methods of treatment have been discussed with the patient and family. After consideration of risks, benefits and other options for treatment, the patient has consented to  Procedure(s): ARTHROPLASTY, KNEE, TOTAL (Right) as a surgical intervention.  The patient's history has been reviewed, patient examined, no change in status, stable for surgery.  I have reviewed the patient's chart and labs.  Questions were answered to the patient's satisfaction.     Samuel Crock Shayna Eblen

## 2023-06-26 NOTE — Op Note (Signed)
 OPERATIVE REPORT-TOTAL KNEE ARTHROPLASTY   Pre-operative diagnosis- Osteoarthritis  Right knee(s)  Post-operative diagnosis- Osteoarthritis Right knee(s)  Procedure-  Right  Total Knee Arthroplasty  Surgeon- Marc Loft. Fawzi Melman, MD  Assistant- [name], PA-C   Anesthesia-   Adductor canal block and spinal  EBL- 25 ml   Drains None  Tourniquet time-  Total Tourniquet Time Documented: Thigh (Right) - 36 minutes Total: Thigh (Right) - 36 minutes     Complications- None  Condition-PACU - hemodynamically stable.   Brief Clinical Note  Marc Faulkner is a 68 y.o. year old male with end stage OA of his right knee with progressively worsening pain and dysfunction. He has constant pain, with activity and at rest and significant functional deficits with difficulties even with ADLs. He has had extensive non-op management including analgesics, injections of cortisone and viscosupplements, and home exercise program, but remains in significant pain with significant dysfunction. Radiographs show bone on bone arthritis medial and patellofemoral. He presents now for right Total Knee Arthroplasty.     Procedure in detail---   The patient is brought into the operating room and positioned supine on the operating table. After successful administration of  Adductor canal block and spinal,   a tourniquet is placed high on the  Right thigh(s) and the lower extremity is prepped and draped in the usual sterile fashion. Time out is performed by the operating team and then the  Right lower extremity is wrapped in Esmarch, knee flexed and the tourniquet inflated to 300 mmHg.       A midline incision is made with a ten blade through the subcutaneous tissue to the level of the extensor mechanism. A fresh blade is used to make a medial parapatellar arthrotomy. Soft tissue over the proximal medial tibia is subperiosteally elevated to the joint line with a knife and into the semimembranosus bursa with a  Cobb elevator. Soft tissue over the proximal lateral tibia is elevated with attention being paid to avoiding the patellar tendon on the tibial tubercle. The patella is everted, knee flexed 90 degrees and the ACL and PCL are removed. Findings are bone on bone medial and patellofemoral with large global osteophytes.        The drill is used to create a starting hole in the distal femur and the canal is thoroughly irrigated with sterile saline to remove the fatty contents. The 5 degree Right  valgus alignment guide is placed into the femoral canal and the distal femoral cutting block is pinned to remove 9 mm off the distal femur. Resection is made with an oscillating saw.      The tibia is subluxed forward and the menisci are removed. The extramedullary alignment guide is placed referencing proximally at the medial aspect of the tibial tubercle and distally along the second metatarsal axis and tibial crest. The block is pinned to remove 2mm off the more deficient medial  side. Resection is made with an oscillating saw. Size 7is the most appropriate size for the tibia and the proximal tibia is prepared with the modular drill and keel punch for that size.      The femoral sizing guide is placed and size 7 is most appropriate. Rotation is marked off the epicondylar axis and confirmed by creating a rectangular flexion gap at 90 degrees. The size 7 cutting block is pinned in this rotation and the anterior, posterior and chamfer cuts are made with the oscillating saw. The intercondylar block is then placed and that cut is  made.      Trial size 7 tibial component, trial size 7 posterior stabilized femur and a 8  mm posterior stabilized rotating platform insert trial is placed. Full extension is achieved with excellent varus/valgus and anterior/posterior balance throughout full range of motion. The patella is everted and thickness measured to be 27  mm. Free hand resection is taken to 15 mm, a 41 template is placed, lug  holes are drilled, trial patella is placed, and it tracks normally. Osteophytes are removed off the posterior femur with the trial in place. All trials are removed and the cut bone surfaces prepared with pulsatile lavage. Cement is mixed and once ready for implantation, the size 7 tibial implant, size  7 posterior stabilized femoral component, and the size 41 patella are cemented in place and the patella is held with the clamp. The trial insert is placed and the knee held in full extension. The Exparel (20 ml mixed with 60 ml saline) is injected into the extensor mechanism, posterior capsule, medial and lateral gutters and subcutaneous tissues.  All extruded cement is removed and once the cement is hard the permanent 8 mm posterior stabilized rotating platform insert is placed into the tibial tray.      The wound is copiously irrigated with saline solution and the extensor mechanism closed with # 0 Stratofix suture. The tourniquet is released for a total tourniquet time of 36  minutes. Flexion against gravity is 140 degrees and the patella tracks normally. Subcutaneous tissue is closed with 2.0 vicryl and subcuticular with running 4.0 Monocryl. The incision is cleaned and dried and steri-strips and a bulky sterile dressing are applied. The limb is placed into a knee immobilizer and the patient is awakened and transported to recovery in stable condition.      Please note that a surgical assistant was a medical necessity for this procedure in order to perform it in a safe and expeditious manner. Surgical assistant was necessary to retract the ligaments and vital neurovascular structures to prevent injury to them and also necessary for proper positioning of the limb to allow for anatomic placement of the prosthesis.   Marc Loft Priyal Musquiz, MD    06/26/2023, 11:25 AM

## 2023-06-26 NOTE — Evaluation (Signed)
 Physical Therapy Evaluation Patient Details Name: Marc Faulkner MRN: [id number] DOB: Feb 04, 1956 Today's Date: 06/26/2023  History of Present Illness  Pt is 68 yo male s/p R TKA on 06/26/23.  Pt with hx including but not limited to OA, DDD  Clinical Impression  Pt is s/p TKA resulting in the deficits listed below (see PT Problem List). At baseline, pt is independent.  HE has DME and family support with ramp to enter home.  Today, pt with increased pain but motivated to try therapy.  Pt with good quad contraction and able to ambulate 22' with RW and CGA.  Notified RN pt requesting pain meds.  Pt expected to progress well.  Pt will benefit from acute skilled PT to increase their independence and safety with mobility to allow discharge.          If plan is discharge home, recommend the following: A little help with walking and/or transfers;A little help with bathing/dressing/bathroom;Assistance with cooking/housework;Help with stairs or ramp for entrance   Can travel by private vehicle        Equipment Recommendations None recommended by PT  Recommendations for Other Services       Functional Status Assessment Patient has had a recent decline in their functional status and demonstrates the ability to make significant improvements in function in a reasonable and predictable amount of time.     Precautions / Restrictions Precautions Precautions: Fall;Knee Restrictions Weight Bearing Restrictions Per Provider Order: Yes RLE Weight Bearing Per Provider Order: Weight bearing as tolerated      Mobility  Bed Mobility Overal bed mobility: Needs Assistance Bed Mobility: Supine to Sit     Supine to sit: Min assist          Transfers Overall transfer level: Needs assistance Equipment used: Rolling walker (2 wheels) Transfers: Sit to/from Stand Sit to Stand: Min assist, From elevated surface           General transfer comment: cues for hand placement     Ambulation/Gait Ambulation/Gait assistance: Contact guard assist Gait Distance (Feet): 60 Feet Assistive device: Rolling walker (2 wheels) Gait Pattern/deviations: Step-to pattern, Decreased stride length, Decreased weight shift to right Gait velocity: decreased     General Gait Details: cues for pushing RW and RW proximity  Stairs            Wheelchair Mobility     Tilt Bed    Modified Rankin (Stroke Patients Only)       Balance Overall balance assessment: Needs assistance Sitting-balance support: No upper extremity supported Sitting balance-Leahy Scale: Good     Standing balance support: Bilateral upper extremity supported, Reliant on assistive device for balance Standing balance-Leahy Scale: Poor Standing balance comment: steady with RW                             Pertinent Vitals/Pain Pain Assessment Pain Assessment: 0-10 Pain Score: 7  Pain Location: R knee Pain Descriptors / Indicators: Discomfort Pain Intervention(s): Limited activity within patient's tolerance, Monitored during session, Premedicated before session, Repositioned, Ice applied    Home Living Family/patient expects to be discharged to:: Private residence Living Arrangements: Spouse/significant other;Children Available Help at Discharge: Family;Available 24 hours/day Type of Home: House Home Access: Ramped entrance       Home Layout: One level Home Equipment: Agricultural consultant (2 wheels)      Prior Function Prior Level of Function : Independent/Modified Independent;Driving  Mobility Comments: could ambulate in community witout AD ADLs Comments: independent adls and iadls     Extremity/Trunk Assessment   Upper Extremity Assessment Upper Extremity Assessment: Overall WFL for tasks assessed    Lower Extremity Assessment Lower Extremity Assessment: LLE deficits/detail;RLE deficits/detail RLE Deficits / Details: Expected post op changes; ROM 10 to 60  degrees; MMT: ankle 5/5, knee 2/5, hip 3/5 RLE Sensation: WNL LLE Deficits / Details: ROM WFL  MMT 5/5 LLE Sensation: WNL    Cervical / Trunk Assessment Cervical / Trunk Assessment: Normal  Communication        Cognition Arousal: Alert Behavior During Therapy: WFL for tasks assessed/performed   PT - Cognitive impairments: No apparent impairments                                 Cueing       General Comments General comments (skin integrity, edema, etc.): Educated on resting with leg straight    Exercises     Assessment/Plan    PT Assessment Patient needs continued PT services  PT Problem List Decreased strength;Pain;Decreased range of motion;Decreased activity tolerance;Decreased balance;Decreased mobility;Decreased knowledge of use of DME       PT Treatment Interventions DME instruction;Therapeutic exercise;Gait training;Balance training;Stair training;Functional mobility training;Therapeutic activities;Patient/family education;Modalities    PT Goals (Current goals can be found in the Care Plan section)  Acute Rehab PT Goals Patient Stated Goal: return home PT Goal Formulation: With patient/family Time For Goal Achievement: 07/10/23 Potential to Achieve Goals: Good    Frequency 7X/week     Co-evaluation               AM-PAC PT "6 Clicks" Mobility  Outcome Measure Help needed turning from your back to your side while in a flat bed without using bedrails?: A Little Help needed moving from lying on your back to sitting on the side of a flat bed without using bedrails?: A Little Help needed moving to and from a bed to a chair (including a wheelchair)?: A Little Help needed standing up from a chair using your arms (e.g., wheelchair or bedside chair)?: A Little Help needed to walk in hospital room?: A Little Help needed climbing 3-5 steps with a railing? : A Little 6 Click Score: 18    End of Session Equipment Utilized During Treatment: Gait  belt Activity Tolerance: Patient limited by pain Patient left: with chair alarm set;in chair;with call bell/phone within reach Nurse Communication: Mobility status;Patient requests pain meds PT Visit Diagnosis: Other abnormalities of gait and mobility (R26.89);Muscle weakness (generalized) (M62.81)    Time: 1610-9604 PT Time Calculation (min) (ACUTE ONLY): 30 min   Charges:   PT Evaluation $PT Eval Low Complexity: 1 Low PT Treatments $Gait Training: 8-22 mins PT General Charges $$ ACUTE PT VISIT: 1 Visit         Cyd Dowse, PT Acute Rehab [facility] [phone]   Carolynn Citrin 06/26/2023, 4:32 PM

## 2023-06-26 NOTE — Anesthesia Procedure Notes (Signed)
 Spinal  Patient location during procedure: OR End time: 06/26/2023 8:25 AM Reason for block: surgical anesthesia Staffing Performed: resident/CRNA  Resident/CRNA: Maxene Span D, CRNA Performed by: Anthony Roland, Clinical cytogeneticist D, CRNA Authorized by: Rosalita Combe, MD   Preanesthetic Checklist Completed: patient identified, IV checked, site marked, risks and benefits discussed, surgical consent, monitors and equipment checked, pre-op evaluation and timeout performed Spinal Block Patient position: sitting Prep: DuraPrep Patient monitoring: heart rate, continuous pulse ox and blood pressure Approach: midline Location: L3-4 Injection technique: single-shot Needle Needle type: Pencan  Needle gauge: 24 G Needle length: 9 cm Assessment Sensory level: T6 Events: CSF return

## 2023-06-26 NOTE — Progress Notes (Signed)
 Orthopedic Tech Progress Note Patient Details:  Marc Faulkner Oct 04, 1955 [id number]  CPM Right Knee CPM Right Knee: Off Right Knee Flexion (Degrees): 40 Right Knee Extension (Degrees): 10  Post Interventions Patient Tolerated: Well  Toi Foster 06/26/2023, 3:38 PM

## 2023-06-26 NOTE — Anesthesia Postprocedure Evaluation (Signed)
 Anesthesia Post Note  Patient: Marc Faulkner  Procedure(s) Performed: ARTHROPLASTY, KNEE, TOTAL (Right: Knee)     Patient location during evaluation: PACU Anesthesia Type: Regional and General Level of consciousness: awake and alert Pain management: pain level controlled Vital Signs Assessment: post-procedure vital signs reviewed and stable Respiratory status: spontaneous breathing, nonlabored ventilation, respiratory function stable and patient connected to nasal cannula oxygen Cardiovascular status: blood pressure returned to baseline and stable Postop Assessment: no apparent nausea or vomiting Anesthetic complications: no  No notable events documented.  Last Vitals:  Vitals:   06/26/23 1255 06/26/23 1537  BP: 116/72 131/71  Pulse: 61 72  Resp: 16 20  Temp: 36.6 C 36.4 C  SpO2: 100% 97%    Last Pain:  Vitals:   06/26/23 1537  TempSrc: Oral  PainSc:                  Rosalita Combe

## 2023-06-26 NOTE — Care Plan (Signed)
 Ortho Bundle Case Management Note  Patient Details  Name: Marc Faulkner MRN: [id number] Date of Birth: [birth year]  RT TKA on 06/26/23  DCP: Home with wife/daughter DME: No needs, has RW PT: EO Leslie 5/8                   DME Arranged:  N/A DME Agency:  NA  HH Arranged:    HH Agency:     Additional Comments: Please contact me with any questions of if this plan should need to change.  Kathlene Paradise, Case Manager [facility] [phone] Ext. [phone]   06/26/2023, 9:04 AM

## 2023-06-26 NOTE — Anesthesia Procedure Notes (Signed)
 Anesthesia Regional Block: Adductor canal block   Pre-Anesthetic Checklist: , timeout performed,  Correct Patient, Correct Site, Correct Laterality,  Correct Procedure, Correct Position, site marked,  Risks and benefits discussed,  Surgical consent,  Pre-op evaluation,  At surgeon's request and post-op pain management  Laterality: Lower and Right  Prep: chloraprep       Needles:  Injection technique: Single-shot  Needle Type: Echogenic Needle     Needle Length: 9cm  Needle Gauge: 22     Additional Needles:   Procedures:,,,, ultrasound used (permanent image in chart),,    Narrative:  Start time: 06/26/2023 9:52 AM End time: 06/26/2023 9:58 AM Injection made incrementally with aspirations every 5 mL.  Performed by: Personally  Anesthesiologist: Rosalita Combe, MD  Additional Notes: Block assessed prior to surgery. Pt tolerated procedure well.

## 2023-06-26 NOTE — Transfer of Care (Signed)
 Immediate Anesthesia Transfer of Care Note  Patient: Marc Faulkner  Procedure(s) Performed: ARTHROPLASTY, KNEE, TOTAL (Right: Knee)  Patient Location: PACU  Anesthesia Type:Regional and Spinal  Level of Consciousness: awake, alert , and oriented  Airway & Oxygen Therapy: Patient Spontanous Breathing and Patient connected to face mask oxygen  Post-op Assessment: Report given to RN  Post vital signs: Reviewed and stable  Last Vitals:  Vitals Value Taken Time  BP    Temp    Pulse 72 06/26/23 1144  Resp    SpO2 96 % 06/26/23 1144  Vitals shown include unfiled device data.  Last Pain:  Vitals:   06/26/23 1005  TempSrc:   PainSc: 0-No pain         Complications: No notable events documented.

## 2023-06-27 ENCOUNTER — Encounter (HOSPITAL_COMMUNITY): Payer: Self-pay | Admitting: Orthopedic Surgery

## 2023-06-27 DIAGNOSIS — M1711 Unilateral primary osteoarthritis, right knee: Secondary | ICD-10-CM | POA: Diagnosis not present

## 2023-06-27 LAB — CBC
HCT: 40.1 % (ref 39.0–52.0)
Hemoglobin: 12.9 g/dL — ABNORMAL LOW (ref 13.0–17.0)
MCH: 29.3 pg (ref 26.0–34.0)
MCHC: 32.2 g/dL (ref 30.0–36.0)
MCV: 91.1 fL (ref 80.0–100.0)
Platelets: 228 10*3/uL (ref 150–400)
RBC: 4.4 MIL/uL (ref 4.22–5.81)
RDW: 12.1 % (ref 11.5–15.5)
WBC: 11.9 10*3/uL — ABNORMAL HIGH (ref 4.0–10.5)
nRBC: 0 % (ref 0.0–0.2)

## 2023-06-27 LAB — BASIC METABOLIC PANEL WITH GFR
Anion gap: 6 (ref 5–15)
BUN: 12 mg/dL (ref 8–23)
CO2: 27 mmol/L (ref 22–32)
Calcium: 8.9 mg/dL (ref 8.9–10.3)
Chloride: 106 mmol/L (ref 98–111)
Creatinine, Ser: 0.82 mg/dL (ref 0.61–1.24)
GFR, Estimated: >60 mL/min (ref >60)
Glucose, Bld: 149 mg/dL — ABNORMAL HIGH (ref 70–99)
Potassium: 4.2 mmol/L (ref 3.5–5.1)
Sodium: 139 mmol/L (ref 135–145)

## 2023-06-27 MED ORDER — ASPIRIN 81 MG PO CHEW: 81.0000 mg | CHEWABLE_TABLET | Freq: Two times a day (BID) | ORAL | 0 refills | Status: AC

## 2023-06-27 MED ORDER — TRAMADOL HCL 50 MG PO TABS
50.0000 mg | ORAL_TABLET | Freq: Four times a day (QID) | ORAL | 0 refills | Status: AC | PRN
Start: 2023-06-27 — End: ?

## 2023-06-27 MED ORDER — METHOCARBAMOL 500 MG PO TABS: 500.0000 mg | ORAL_TABLET | Freq: Four times a day (QID) | ORAL | 0 refills | Status: AC | PRN

## 2023-06-27 MED ORDER — OXYCODONE HCL 5 MG PO TABS: 5.0000 mg | ORAL_TABLET | Freq: Four times a day (QID) | ORAL | 0 refills | Status: AC | PRN

## 2023-06-27 MED ORDER — ONDANSETRON HCL 4 MG PO TABS: 4.0000 mg | ORAL_TABLET | Freq: Four times a day (QID) | ORAL | 0 refills | Status: AC | PRN

## 2023-06-27 MED ORDER — GABAPENTIN 300 MG PO CAPS: 300.0000 mg | ORAL_CAPSULE | ORAL | 0 refills | Status: AC

## 2023-06-27 NOTE — TOC Transition Note (Signed)
 Transition of Care [facility]) - Discharge Note   Patient Details  Name: Marc Faulkner MRN: [id number] Date of Birth: 02/07/1956  [facility]) CM/SW Contact:  Delilah Fend, LCSW Phone Number: 06/27/2023, 11:06 AM   Clinical Narrative:     Met with pt who confirms he has needed DME in the home.  OPPT already arranged with [facility] ([place]).  No further TOC needs.  Final next level of care: OP Rehab Barriers to Discharge: No Barriers Identified   Patient Goals and CMS Choice Patient states their goals for this hospitalization and ongoing recovery are:: return home          Discharge Placement                       Discharge Plan and Services Additional resources added to the After Visit Summary for                  DME Arranged: N/A DME Agency: NA                  Social Drivers of Health (SDOH) Interventions SDOH Screenings   Food Insecurity: No Food Insecurity (06/26/2023)  Housing: Low Risk  (06/26/2023)  Transportation Needs: No Transportation Needs (06/26/2023)  Utilities: Not At Risk (06/26/2023)  Social Connections: Patient Declined (06/26/2023)  Tobacco Use: Low Risk  (06/26/2023)     Readmission Risk Interventions     No data to display

## 2023-06-27 NOTE — Progress Notes (Signed)
 Physical Therapy Treatment Patient Details Name: Marc Faulkner MRN: [id number] DOB: 06/25/1955 Today's Date: 06/27/2023   History of Present Illness Pt is 68 yo male s/p R TKA on 06/26/23.  Pt with hx including but not limited to OA, DDD    PT Comments  Pt is POD # 1 and is progressing well. Pt with good pain control, ROM, quad activation, and understanding of HEP and safety.  He has outpt PT scheduled later in week.  Pt was able to ambulate 100' safely and has a ramp to enter his home.  Pt demonstrates safe gait & transfers in order to return home from PT perspective once discharged by MD.  While in hospital, will continue to benefit from PT for skilled therapy to advance mobility and exercises.       If plan is discharge home, recommend the following: A little help with walking and/or transfers;A little help with bathing/dressing/bathroom;Assistance with cooking/housework;Help with stairs or ramp for entrance   Can travel by private vehicle        Equipment Recommendations  None recommended by PT    Recommendations for Other Services       Precautions / Restrictions Precautions Precautions: Fall;Knee Required Braces or Orthoses: Knee Immobilizer - Right Knee Immobilizer - Right: Discontinue once straight leg raise with < 10 degree lag Restrictions RLE Weight Bearing Per Provider Order: Weight bearing as tolerated     Mobility  Bed Mobility Overal bed mobility: Needs Assistance Bed Mobility: Supine to Sit     Supine to sit: Supervision          Transfers Overall transfer level: Needs assistance Equipment used: Rolling walker (2 wheels) Transfers: Sit to/from Stand Sit to Stand: Supervision           General transfer comment: good hand placement and R LE management; performed x 2    Ambulation/Gait Ambulation/Gait assistance: Supervision Gait Distance (Feet): 100 Feet Assistive device: Rolling walker (2 wheels) Gait Pattern/deviations: Step-to pattern,  Decreased weight shift to right Gait velocity: decreased     General Gait Details: Pt starting with step to L gait - cued for step to R and reports improved pain level.  Also cues to push RW not pick up.  Good carryover, good stability   Stairs Stairs:  (has ramp)           Wheelchair Mobility     Tilt Bed    Modified Rankin (Stroke Patients Only)       Balance Overall balance assessment: Needs assistance Sitting-balance support: No upper extremity supported Sitting balance-Leahy Scale: Good     Standing balance support: Bilateral upper extremity supported, No upper extremity supported Standing balance-Leahy Scale: Fair Standing balance comment: RW to ambulate ; could stand without AD                            Communication    Cognition Arousal: Alert Behavior During Therapy: WFL for tasks assessed/performed   PT - Cognitive impairments: No apparent impairments                                Cueing    Exercises Total Joint Exercises Ankle Circles/Pumps: AROM, Both, 10 reps, Supine Quad Sets: AROM, Both, 10 reps, Supine Heel Slides: AAROM, Right, 10 reps, Supine Hip ABduction/ADduction: AAROM, Right, 10 reps, Supine Long Arc Quad: Right, 5 reps, Seated, Limitations, AAROM Long Arc  Quad Limitations: only 5 reps due to pain Knee Flexion: AROM, Right, 10 reps, Seated Other Exercises Other Exercises: educated on AAROM techniques ROM: Knee 5 to 70 degrees    General Comments   Educated on safe ice use, no pivots, car transfers, resting with leg straight, and TED hose during day. Also, encouraged walking every 1-2 hours during day. Educated on HEP with focus on mobility the first weeks. Discussed doing exercises within pain control and if pain increasing could decreased ROM, reps, and stop exercises as needed. Encouraged to perform quad sets and ankle pumps frequently for blood flow and to promote full knee extension.      Pertinent  Vitals/Pain Pain Assessment Pain Assessment: 0-10 Pain Score: 4  Pain Location: R knee Pain Descriptors / Indicators: Discomfort Pain Intervention(s): Limited activity within patient's tolerance, Monitored during session, Premedicated before session, Repositioned, Ice applied    Home Living                          Prior Function            PT Goals (current goals can now be found in the care plan section) Progress towards PT goals: Progressing toward goals    Frequency    7X/week      PT Plan      Co-evaluation              AM-PAC PT "6 Clicks" Mobility   Outcome Measure  Help needed turning from your back to your side while in a flat bed without using bedrails?: A Little Help needed moving from lying on your back to sitting on the side of a flat bed without using bedrails?: A Little Help needed moving to and from a bed to a chair (including a wheelchair)?: A Little Help needed standing up from a chair using your arms (e.g., wheelchair or bedside chair)?: A Little Help needed to walk in hospital room?: A Little Help needed climbing 3-5 steps with a railing? : A Little 6 Click Score: 18    End of Session Equipment Utilized During Treatment: Gait belt Activity Tolerance: Patient tolerated treatment well Patient left: in chair;with call bell/phone within reach;with family/visitor present Nurse Communication: Mobility status (cleared PT) PT Visit Diagnosis: Other abnormalities of gait and mobility (R26.89);Muscle weakness (generalized) (M62.81)     Time: 1610-9604 PT Time Calculation (min) (ACUTE ONLY): 26 min  Charges:    $Gait Training: 8-22 mins $Therapeutic Exercise: 8-22 mins PT General Charges $$ ACUTE PT VISIT: 1 Visit                     Cyd Dowse, PT Acute Rehab Services [facility] [phone]    Carolynn Citrin 06/27/2023, 11:09 AM

## 2023-06-27 NOTE — Progress Notes (Signed)
 Subjective: 1 Day Post-Op Procedure(s) (LRB): ARTHROPLASTY, KNEE, TOTAL (Right) Patient reports pain as mild.   Patient seen in rounds by Dr. France Ina. Patient is well, and has had no acute complaints or problems No issues overnight. Denies chest pain, SOB, or calf pain. Foley catheter removed this AM.  We will continue therapy today, ambulated 60' yesterday.   Objective: Vital signs in last 24 hours: Temp:  [97.5 F (36.4 C)-98.8 F (37.1 C)] 98.8 F (37.1 C) (05/06 0542) Pulse Rate:  [61-85] 73 (05/06 0542) Resp:  [7-20] 15 (05/06 0542) BP: (100-155)/(66-79) 133/68 (05/06 0542) SpO2:  [93 %-100 %] 93 % (05/06 0542) Weight:  [102.5 kg] 102.5 kg (05/05 0802)  Intake/Output from previous day:  Intake/Output Summary (Last 24 hours) at 06/27/2023 0737 Last data filed at 06/27/2023 0600 Gross per 24 hour  Intake 2611.44 ml  Output 2875 ml  Net -263.56 ml     Intake/Output this shift: No intake/output data recorded.  Labs: Recent Labs    06/27/23 0339  HGB 12.9*   Recent Labs    06/27/23 0339  WBC 11.9*  RBC 4.40  HCT 40.1  PLT 228   Recent Labs    06/27/23 0339  NA 139  K 4.2  CL 106  CO2 27  BUN 12  CREATININE 0.82  GLUCOSE 149*  CALCIUM 8.9   No results for input(s): "LABPT", "INR" in the last 72 hours.  Exam: General - Patient is Alert and Oriented Extremity - Neurologically intact Neurovascular intact Sensation intact distally Dorsiflexion/Plantar flexion intact Dressing - dressing C/D/I Motor Function - intact, moving foot and toes well on exam.   Past Medical History:  Diagnosis Date   Allergic rhinitis, seasonal    Benign prostatic hyperplasia with lower urinary tract symptoms    Bleeding external hemorrhoids    Candidal balanitis    Chronic sinusitis    Complication of anesthesia    DDD (degenerative disc disease), cervical    DDD (degenerative disc disease), thoracic    Esophageal reflux 07/23/2020   Fracture of finger, distal  phalanx, right, open    Fracture of left ankle    Gastric polyp    Gastritis    GERD (gastroesophageal reflux disease)    Hearing loss 07/23/2020   Hernia, umbilical    Hypercholesteremia 07/23/2020   Hyperlipidemia    Impotence of organic origin    Ingrown nail 12/15/2016   Lumbar spondylolysis    Neural foraminal stenosis of cervical spine    Osteoarthritis 07/23/2020   Plantar fasciitis 12/15/2016   Pneumonia    PONV (postoperative nausea and vomiting)    Rosacea    Tightness of heel cord, left 12/29/2016   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   Weight as of this encounter: 102.5 kg. Advance diet Up with therapy D/C IV fluids   Patient's anticipated LOS is less than 2 midnights, meeting these requirements: - Lives within 1 hour of care - Has a competent adult at home to recover with post-op recover - NO history of             - Chronic pain requiring opioids             -  Diabetes             - Coronary Artery Disease             - Heart failure             - Heart attack             - Stroke             - DVT/VTE             - Cardiac arrhythmia             - Respiratory Failure/COPD             - Renal failure             - Anemia             - Advanced Liver disease    DVT Prophylaxis - Aspirin Weight bearing as tolerated. Continue therapy.  Plan is to go Home after hospital stay. Plan for discharge later today if progresses with therapy and meeting goals. Scheduled for [facility]T at [facility]). Follow-up in the office in 2 weeks.  The PDMP database was reviewed today prior to any opioid medications being prescribed to this  patient.  Sharlynn Dear, PA-C Orthopedic Surgery [phone] 06/27/2023, 7:37 AM

## 2023-06-27 NOTE — Plan of Care (Signed)
  Problem: Activity: Goal: Risk for activity intolerance will decrease Outcome: Progressing   Problem: Pain Managment: Goal: General experience of comfort will improve and/or be controlled Outcome: Progressing   Problem: Activity: Goal: Ability to avoid complications of mobility impairment will improve Outcome: Progressing Goal: Range of joint motion will improve Outcome: Progressing   Problem: Pain Management: Goal: Pain level will decrease with appropriate interventions Outcome: Progressing

## 2023-06-29 DIAGNOSIS — R29898 Other symptoms and signs involving the musculoskeletal system: Secondary | ICD-10-CM | POA: Diagnosis not present

## 2023-06-29 DIAGNOSIS — M25561 Pain in right knee: Secondary | ICD-10-CM | POA: Diagnosis not present

## 2023-06-29 DIAGNOSIS — R269 Unspecified abnormalities of gait and mobility: Secondary | ICD-10-CM | POA: Diagnosis not present

## 2023-06-29 DIAGNOSIS — M25661 Stiffness of right knee, not elsewhere classified: Secondary | ICD-10-CM | POA: Diagnosis not present

## 2023-07-03 DIAGNOSIS — M25661 Stiffness of right knee, not elsewhere classified: Secondary | ICD-10-CM | POA: Diagnosis not present

## 2023-07-03 DIAGNOSIS — R29898 Other symptoms and signs involving the musculoskeletal system: Secondary | ICD-10-CM | POA: Diagnosis not present

## 2023-07-03 DIAGNOSIS — R269 Unspecified abnormalities of gait and mobility: Secondary | ICD-10-CM | POA: Diagnosis not present

## 2023-07-03 DIAGNOSIS — M25561 Pain in right knee: Secondary | ICD-10-CM | POA: Diagnosis not present

## 2023-07-06 DIAGNOSIS — R269 Unspecified abnormalities of gait and mobility: Secondary | ICD-10-CM | POA: Diagnosis not present

## 2023-07-06 DIAGNOSIS — R29898 Other symptoms and signs involving the musculoskeletal system: Secondary | ICD-10-CM | POA: Diagnosis not present

## 2023-07-06 DIAGNOSIS — M25661 Stiffness of right knee, not elsewhere classified: Secondary | ICD-10-CM | POA: Diagnosis not present

## 2023-07-06 DIAGNOSIS — M25561 Pain in right knee: Secondary | ICD-10-CM | POA: Diagnosis not present

## 2023-07-07 NOTE — Discharge Summary (Signed)
 Patient ID: Marc Faulkner MRN: [id number] DOB/AGE: [birth year] 68 y.o.  Admit date: 06/26/2023 Discharge date: 06/27/2023  Admission Diagnoses:  Principal Problem:   OA (osteoarthritis) of knee Active Problems:   Primary osteoarthritis of right knee   Discharge Diagnoses:  Same  Past Medical History:  Diagnosis Date   Allergic rhinitis, seasonal    Benign prostatic hyperplasia with lower urinary tract symptoms    Bleeding external hemorrhoids    Candidal balanitis    Chronic sinusitis    Complication of anesthesia    DDD (degenerative disc disease), cervical    DDD (degenerative disc disease), thoracic    Esophageal reflux 07/23/2020   Fracture of finger, distal phalanx, right, open    Fracture of left ankle    Gastric polyp    Gastritis    GERD (gastroesophageal reflux disease)    Hearing loss 07/23/2020   Hernia, umbilical    Hypercholesteremia 07/23/2020   Hyperlipidemia    Impotence of organic origin    Ingrown nail 12/15/2016   Lumbar spondylolysis    Neural foraminal stenosis of cervical spine    Osteoarthritis 07/23/2020   Plantar fasciitis 12/15/2016   Pneumonia    PONV (postoperative nausea and vomiting)    Rosacea    Tightness of heel cord, left 12/29/2016   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 male who was admitted 06/26/2023 for operative treatment ofOA (osteoarthritis) of knee. Patient has severe unremitting pain that affects sleep, daily activities, and work/hobbies. After pre-op clearance the patient was taken to the operating room on 06/26/2023 and underwent  Procedure(s): ARTHROPLASTY, KNEE, TOTAL.    Patient was given perioperative antibiotics:  Anti-infectives (From admission, onward)    Start     Dose/Rate Route Frequency Ordered Stop   06/26/23 1630  ceFAZolin  (ANCEF ) IVPB  2g/100 mL premix        2 g 200 mL/hr over 30 Minutes Intravenous Every 6 hours 06/26/23 1247 06/27/23 0948   06/26/23 0800  ceFAZolin  (ANCEF ) IVPB 2g/100 mL premix        2 g 200 mL/hr over 30 Minutes Intravenous On call to O.R. 06/26/23 4098 06/26/23 1027        Patient was given sequential compression devices, early ambulation, and chemoprophylaxis to prevent DVT.  Patient benefited maximally from hospital stay and there were no complications.    Recent vital signs: No data found.   Recent laboratory studies: No results for input(s): "WBC", "HGB", "HCT", "PLT", "NA", "K", "CL", "CO2", "BUN", "CREATININE", "GLUCOSE", "INR", "CALCIUM" in the last 72 hours.  Invalid input(s): "PT", "2"   Discharge Medications:   Allergies as of 06/27/2023       Reactions   Sulfa Antibiotics Nausea And Vomiting   Fever        Medication List     STOP taking these medications    celecoxib 200 MG capsule Commonly known as: CELEBREX   HYDROcodone-acetaminophen  5-325 MG tablet Commonly known as: NORCO/VICODIN   tiZANidine 4 MG tablet Commonly known as: ZANAFLEX       TAKE these medications    24HR Allergy Relief 180 MG tablet Generic drug: fexofenadine Take 180 mg by mouth daily.   aspirin  81 MG chewable tablet Chew 1 tablet (81 mg total) by mouth 2 (two) times daily for 21 days. Then take one 81 mg aspirin  once a day  for three weeks. Then discontinue aspirin .   docusate sodium  100 MG capsule Commonly known as: COLACE Take 100 mg by mouth daily as needed for mild constipation.   famotidine  40 MG tablet Commonly known as: PEPCID  Take 40 mg by mouth daily.   fenofibrate  160 MG tablet Take 160 mg by mouth daily.   gabapentin  300 MG capsule Commonly known as: NEURONTIN  Take 1 capsule (300 mg total) by mouth as directed. Take a 300 mg capsule three times a day for two weeks following surgery.Then take a 300 mg capsule two times a day for two weeks. Then resume a 300 mg  capsule once a day What changed:  when to take this additional instructions   ipratropium 0.06 % nasal spray Commonly known as: ATROVENT Place 1 spray into both nostrils daily as needed for rhinitis.   methocarbamol  500 MG tablet Commonly known as: ROBAXIN  Take 1 tablet (500 mg total) by mouth every 6 (six) hours as needed for muscle spasms.   ondansetron  4 MG tablet Commonly known as: ZOFRAN  Take 1 tablet (4 mg total) by mouth every 6 (six) hours as needed for nausea.   oxyCODONE  5 MG immediate release tablet Commonly known as: Oxy IR/ROXICODONE  Take 1-2 tablets (5-10 mg total) by mouth every 6 (six) hours as needed for severe pain (pain score 7-10).   Refresh Digital 0.5-1-0.5 % Soln Generic drug: Carboxymeth-Glycerin -Polysorb Place 1 drop into both eyes 3 (three) times daily as needed (dry eyes).   tamsulosin  0.4 MG Caps capsule Commonly known as: FLOMAX  Take 0.4 mg by mouth daily.   traMADol  50 MG tablet Commonly known as: ULTRAM  Take 1-2 tablets (50-100 mg total) by mouth every 6 (six) hours as needed for moderate pain (pain score 4-6).               Discharge Care Instructions  (From admission, onward)           Start     Ordered   06/27/23 0000  Weight bearing as tolerated        06/27/23 0739   06/27/23 0000  Change dressing       Comments: You may remove the bulky bandage (ACE wrap and gauze) two days after surgery. You will have an adhesive waterproof bandage underneath. Leave this in place until your first follow-up appointment.   06/27/23 0739            Diagnostic Studies: No results found.  Disposition: Discharge disposition: 01-Home or Self Care       Discharge Instructions     Call MD / Call 911   Complete by: As directed    If you experience chest pain or shortness of breath, CALL 911 and be transported to the hospital emergency room.  If you develope a fever above 101 F, pus (white drainage) or increased drainage or redness  at the wound, or calf pain, call your surgeon's office.   Change dressing   Complete by: As directed    You may remove the bulky bandage (ACE wrap and gauze) two days after surgery. You will have an adhesive waterproof bandage underneath. Leave this in place until your first follow-up appointment.   Constipation Prevention   Complete by: As directed    Drink plenty of fluids.  Prune juice may be helpful.  You may use a stool softener, such as Colace (over the counter) 100 mg twice a day.  Use MiraLax  (over the counter) for constipation as needed.   Diet -  low sodium heart healthy   Complete by: As directed    Do not put a pillow under the knee. Place it under the heel.   Complete by: As directed    Driving restrictions   Complete by: As directed    No driving for two weeks   Post-operative opioid taper instructions:   Complete by: As directed    POST-OPERATIVE OPIOID TAPER INSTRUCTIONS: It is important to wean off of your opioid medication as soon as possible. If you do not need pain medication after your surgery it is ok to stop day one. Opioids include: Codeine, Hydrocodone(Norco, Vicodin), Oxycodone (Percocet, oxycontin ) and hydromorphone  amongst others.  Long term and even short term use of opiods can cause: Increased pain response Dependence Constipation Depression Respiratory depression And more.  Withdrawal symptoms can include Flu like symptoms Nausea, vomiting And more Techniques to manage these symptoms Hydrate well Eat regular healthy meals Stay active Use relaxation techniques(deep breathing, meditating, yoga) Do Not substitute Alcohol  to help with tapering If you have been on opioids for less than two weeks and do not have pain than it is ok to stop all together.  Plan to wean off of opioids This plan should start within one week post op of your joint replacement. Maintain the same interval or time between taking each dose and first decrease the dose.  Cut the  total daily intake of opioids by one tablet each day Next start to increase the time between doses. The last dose that should be eliminated is the evening dose.      TED hose   Complete by: As directed    Use stockings (TED hose) for three weeks on both leg(s).  You may remove them at night for sleeping.   Weight bearing as tolerated   Complete by: As directed         Follow-up Information     Liliane Rei, MD Follow up on 07/11/2023.   Specialty: Orthopedic Surgery Why: You are scheduled for a post op appointment Tuesday 07/11/23 at 1:00pm Contact information: [address] [place] [address] [phone]         [facility], [place]. Go on 06/29/2023.   Why: You are scheduled for physical therapy Thursday 06/29/23 at 2:15pm Contact information: [address] [place] [address] [phone]                  Signed: Sharlynn Dear 07/07/2023, 9:36 AM

## 2023-07-10 DIAGNOSIS — R269 Unspecified abnormalities of gait and mobility: Secondary | ICD-10-CM | POA: Diagnosis not present

## 2023-07-10 DIAGNOSIS — R29898 Other symptoms and signs involving the musculoskeletal system: Secondary | ICD-10-CM | POA: Diagnosis not present

## 2023-07-10 DIAGNOSIS — M25661 Stiffness of right knee, not elsewhere classified: Secondary | ICD-10-CM | POA: Diagnosis not present

## 2023-07-10 DIAGNOSIS — M25561 Pain in right knee: Secondary | ICD-10-CM | POA: Diagnosis not present

## 2023-07-12 DIAGNOSIS — R29898 Other symptoms and signs involving the musculoskeletal system: Secondary | ICD-10-CM | POA: Diagnosis not present

## 2023-07-12 DIAGNOSIS — M25561 Pain in right knee: Secondary | ICD-10-CM | POA: Diagnosis not present

## 2023-07-12 DIAGNOSIS — R269 Unspecified abnormalities of gait and mobility: Secondary | ICD-10-CM | POA: Diagnosis not present

## 2023-07-12 DIAGNOSIS — M25661 Stiffness of right knee, not elsewhere classified: Secondary | ICD-10-CM | POA: Diagnosis not present

## 2023-07-19 DIAGNOSIS — R269 Unspecified abnormalities of gait and mobility: Secondary | ICD-10-CM | POA: Diagnosis not present

## 2023-07-19 DIAGNOSIS — R29898 Other symptoms and signs involving the musculoskeletal system: Secondary | ICD-10-CM | POA: Diagnosis not present

## 2023-07-19 DIAGNOSIS — M25661 Stiffness of right knee, not elsewhere classified: Secondary | ICD-10-CM | POA: Diagnosis not present

## 2023-07-19 DIAGNOSIS — M25561 Pain in right knee: Secondary | ICD-10-CM | POA: Diagnosis not present

## 2023-07-20 DIAGNOSIS — M25661 Stiffness of right knee, not elsewhere classified: Secondary | ICD-10-CM | POA: Diagnosis not present

## 2023-07-20 DIAGNOSIS — R29898 Other symptoms and signs involving the musculoskeletal system: Secondary | ICD-10-CM | POA: Diagnosis not present

## 2023-07-20 DIAGNOSIS — M25561 Pain in right knee: Secondary | ICD-10-CM | POA: Diagnosis not present

## 2023-07-20 DIAGNOSIS — R269 Unspecified abnormalities of gait and mobility: Secondary | ICD-10-CM | POA: Diagnosis not present

## 2023-07-25 DIAGNOSIS — R29898 Other symptoms and signs involving the musculoskeletal system: Secondary | ICD-10-CM | POA: Diagnosis not present

## 2023-07-25 DIAGNOSIS — M25661 Stiffness of right knee, not elsewhere classified: Secondary | ICD-10-CM | POA: Diagnosis not present

## 2023-07-25 DIAGNOSIS — R269 Unspecified abnormalities of gait and mobility: Secondary | ICD-10-CM | POA: Diagnosis not present

## 2023-07-25 DIAGNOSIS — M25561 Pain in right knee: Secondary | ICD-10-CM | POA: Diagnosis not present

## 2023-07-27 DIAGNOSIS — R29898 Other symptoms and signs involving the musculoskeletal system: Secondary | ICD-10-CM | POA: Diagnosis not present

## 2023-07-27 DIAGNOSIS — M25561 Pain in right knee: Secondary | ICD-10-CM | POA: Diagnosis not present

## 2023-07-27 DIAGNOSIS — M25661 Stiffness of right knee, not elsewhere classified: Secondary | ICD-10-CM | POA: Diagnosis not present

## 2023-07-27 DIAGNOSIS — R269 Unspecified abnormalities of gait and mobility: Secondary | ICD-10-CM | POA: Diagnosis not present

## 2023-08-01 DIAGNOSIS — M25561 Pain in right knee: Secondary | ICD-10-CM | POA: Diagnosis not present

## 2023-08-01 DIAGNOSIS — R29898 Other symptoms and signs involving the musculoskeletal system: Secondary | ICD-10-CM | POA: Diagnosis not present

## 2023-08-01 DIAGNOSIS — Z5189 Encounter for other specified aftercare: Secondary | ICD-10-CM | POA: Diagnosis not present

## 2023-08-01 DIAGNOSIS — M25661 Stiffness of right knee, not elsewhere classified: Secondary | ICD-10-CM | POA: Diagnosis not present

## 2023-08-01 DIAGNOSIS — R269 Unspecified abnormalities of gait and mobility: Secondary | ICD-10-CM | POA: Diagnosis not present

## 2023-08-03 DIAGNOSIS — M25561 Pain in right knee: Secondary | ICD-10-CM | POA: Diagnosis not present

## 2023-08-03 DIAGNOSIS — R269 Unspecified abnormalities of gait and mobility: Secondary | ICD-10-CM | POA: Diagnosis not present

## 2023-08-03 DIAGNOSIS — R29898 Other symptoms and signs involving the musculoskeletal system: Secondary | ICD-10-CM | POA: Diagnosis not present

## 2023-08-03 DIAGNOSIS — M25661 Stiffness of right knee, not elsewhere classified: Secondary | ICD-10-CM | POA: Diagnosis not present

## 2023-08-08 DIAGNOSIS — R29898 Other symptoms and signs involving the musculoskeletal system: Secondary | ICD-10-CM | POA: Diagnosis not present

## 2023-08-08 DIAGNOSIS — M25561 Pain in right knee: Secondary | ICD-10-CM | POA: Diagnosis not present

## 2023-08-08 DIAGNOSIS — R269 Unspecified abnormalities of gait and mobility: Secondary | ICD-10-CM | POA: Diagnosis not present

## 2023-08-08 DIAGNOSIS — M25661 Stiffness of right knee, not elsewhere classified: Secondary | ICD-10-CM | POA: Diagnosis not present

## 2023-08-11 DIAGNOSIS — R29898 Other symptoms and signs involving the musculoskeletal system: Secondary | ICD-10-CM | POA: Diagnosis not present

## 2023-08-11 DIAGNOSIS — M25561 Pain in right knee: Secondary | ICD-10-CM | POA: Diagnosis not present

## 2023-08-11 DIAGNOSIS — M25661 Stiffness of right knee, not elsewhere classified: Secondary | ICD-10-CM | POA: Diagnosis not present

## 2023-08-11 DIAGNOSIS — R269 Unspecified abnormalities of gait and mobility: Secondary | ICD-10-CM | POA: Diagnosis not present

## 2023-08-15 DIAGNOSIS — R269 Unspecified abnormalities of gait and mobility: Secondary | ICD-10-CM | POA: Diagnosis not present

## 2023-08-15 DIAGNOSIS — R29898 Other symptoms and signs involving the musculoskeletal system: Secondary | ICD-10-CM | POA: Diagnosis not present

## 2023-08-15 DIAGNOSIS — M25561 Pain in right knee: Secondary | ICD-10-CM | POA: Diagnosis not present

## 2023-08-15 DIAGNOSIS — M25661 Stiffness of right knee, not elsewhere classified: Secondary | ICD-10-CM | POA: Diagnosis not present

## 2023-08-17 DIAGNOSIS — M25561 Pain in right knee: Secondary | ICD-10-CM | POA: Diagnosis not present

## 2023-08-17 DIAGNOSIS — R29898 Other symptoms and signs involving the musculoskeletal system: Secondary | ICD-10-CM | POA: Diagnosis not present

## 2023-08-17 DIAGNOSIS — R269 Unspecified abnormalities of gait and mobility: Secondary | ICD-10-CM | POA: Diagnosis not present

## 2023-08-17 DIAGNOSIS — M25661 Stiffness of right knee, not elsewhere classified: Secondary | ICD-10-CM | POA: Diagnosis not present

## 2023-08-22 DIAGNOSIS — R269 Unspecified abnormalities of gait and mobility: Secondary | ICD-10-CM | POA: Diagnosis not present

## 2023-08-22 DIAGNOSIS — R29898 Other symptoms and signs involving the musculoskeletal system: Secondary | ICD-10-CM | POA: Diagnosis not present

## 2023-08-22 DIAGNOSIS — M25661 Stiffness of right knee, not elsewhere classified: Secondary | ICD-10-CM | POA: Diagnosis not present

## 2023-08-22 DIAGNOSIS — M25561 Pain in right knee: Secondary | ICD-10-CM | POA: Diagnosis not present

## 2023-08-24 DIAGNOSIS — R29898 Other symptoms and signs involving the musculoskeletal system: Secondary | ICD-10-CM | POA: Diagnosis not present

## 2023-08-24 DIAGNOSIS — R269 Unspecified abnormalities of gait and mobility: Secondary | ICD-10-CM | POA: Diagnosis not present

## 2023-08-24 DIAGNOSIS — M25561 Pain in right knee: Secondary | ICD-10-CM | POA: Diagnosis not present

## 2023-08-24 DIAGNOSIS — M25661 Stiffness of right knee, not elsewhere classified: Secondary | ICD-10-CM | POA: Diagnosis not present

## 2023-08-29 DIAGNOSIS — M25661 Stiffness of right knee, not elsewhere classified: Secondary | ICD-10-CM | POA: Diagnosis not present

## 2023-08-29 DIAGNOSIS — M25561 Pain in right knee: Secondary | ICD-10-CM | POA: Diagnosis not present

## 2023-08-29 DIAGNOSIS — R269 Unspecified abnormalities of gait and mobility: Secondary | ICD-10-CM | POA: Diagnosis not present

## 2023-08-29 DIAGNOSIS — R29898 Other symptoms and signs involving the musculoskeletal system: Secondary | ICD-10-CM | POA: Diagnosis not present

## 2023-08-31 DIAGNOSIS — M25561 Pain in right knee: Secondary | ICD-10-CM | POA: Diagnosis not present

## 2023-08-31 DIAGNOSIS — R29898 Other symptoms and signs involving the musculoskeletal system: Secondary | ICD-10-CM | POA: Diagnosis not present

## 2023-08-31 DIAGNOSIS — R269 Unspecified abnormalities of gait and mobility: Secondary | ICD-10-CM | POA: Diagnosis not present

## 2023-08-31 DIAGNOSIS — M25661 Stiffness of right knee, not elsewhere classified: Secondary | ICD-10-CM | POA: Diagnosis not present

## 2023-10-18 DIAGNOSIS — J302 Other seasonal allergic rhinitis: Secondary | ICD-10-CM | POA: Diagnosis not present

## 2023-12-20 DIAGNOSIS — D2239 Melanocytic nevi of other parts of face: Secondary | ICD-10-CM | POA: Diagnosis not present

## 2023-12-20 DIAGNOSIS — L821 Other seborrheic keratosis: Secondary | ICD-10-CM | POA: Diagnosis not present

## 2023-12-20 DIAGNOSIS — L814 Other melanin hyperpigmentation: Secondary | ICD-10-CM | POA: Diagnosis not present

## 2023-12-20 DIAGNOSIS — D225 Melanocytic nevi of trunk: Secondary | ICD-10-CM | POA: Diagnosis not present

## 2023-12-20 DIAGNOSIS — D485 Neoplasm of uncertain behavior of skin: Secondary | ICD-10-CM | POA: Diagnosis not present

## 2023-12-21 DIAGNOSIS — I700 Atherosclerosis of aorta: Secondary | ICD-10-CM | POA: Diagnosis not present

## 2023-12-21 DIAGNOSIS — L719 Rosacea, unspecified: Secondary | ICD-10-CM | POA: Diagnosis not present

## 2023-12-21 DIAGNOSIS — R7301 Impaired fasting glucose: Secondary | ICD-10-CM | POA: Diagnosis not present

## 2023-12-21 DIAGNOSIS — E785 Hyperlipidemia, unspecified: Secondary | ICD-10-CM | POA: Diagnosis not present

## 2023-12-21 DIAGNOSIS — K219 Gastro-esophageal reflux disease without esophagitis: Secondary | ICD-10-CM | POA: Diagnosis not present

## 2023-12-21 DIAGNOSIS — K449 Diaphragmatic hernia without obstruction or gangrene: Secondary | ICD-10-CM | POA: Diagnosis not present

## 2023-12-21 DIAGNOSIS — E66811 Obesity, class 1: Secondary | ICD-10-CM | POA: Diagnosis not present

## 2023-12-21 DIAGNOSIS — Z23 Encounter for immunization: Secondary | ICD-10-CM | POA: Diagnosis not present

## 2024-01-22 DIAGNOSIS — J302 Other seasonal allergic rhinitis: Secondary | ICD-10-CM | POA: Diagnosis not present

## 2024-01-22 DIAGNOSIS — J019 Acute sinusitis, unspecified: Secondary | ICD-10-CM | POA: Diagnosis not present

## 2024-01-22 DIAGNOSIS — B9689 Other specified bacterial agents as the cause of diseases classified elsewhere: Secondary | ICD-10-CM | POA: Diagnosis not present
